# Patient Record
Sex: Female | Born: 1965 | Race: Black or African American | Hispanic: No | Marital: Single | Smoking: Never smoker
Health system: Southern US, Community
[De-identification: ages and names within clinical notes are randomized; demographics above are authoritative.]

## PROBLEM LIST (undated history)

## (undated) DIAGNOSIS — K589 Irritable bowel syndrome without diarrhea: Secondary | ICD-10-CM

## (undated) DIAGNOSIS — M1990 Unspecified osteoarthritis, unspecified site: Secondary | ICD-10-CM

## (undated) DIAGNOSIS — K219 Gastro-esophageal reflux disease without esophagitis: Secondary | ICD-10-CM

## (undated) DIAGNOSIS — R112 Nausea with vomiting, unspecified: Secondary | ICD-10-CM

## (undated) DIAGNOSIS — K5909 Other constipation: Secondary | ICD-10-CM

## (undated) HISTORY — DX: Unspecified osteoarthritis, unspecified site: M19.90

## (undated) HISTORY — PX: ABDOMINAL HYSTERECTOMY: SHX81

## (undated) HISTORY — PX: BREAST BIOPSY: SHX20

---

## 2003-08-12 ENCOUNTER — Emergency Department (HOSPITAL_COMMUNITY): Admission: EM | Admit: 2003-08-12 | Discharge: 2003-08-12 | Payer: Self-pay | Admitting: Emergency Medicine

## 2005-08-20 ENCOUNTER — Encounter (INDEPENDENT_AMBULATORY_CARE_PROVIDER_SITE_OTHER): Payer: Self-pay | Admitting: Specialist

## 2005-08-20 ENCOUNTER — Inpatient Hospital Stay (HOSPITAL_COMMUNITY): Admission: RE | Admit: 2005-08-20 | Discharge: 2005-08-22 | Payer: Self-pay | Admitting: Obstetrics & Gynecology

## 2009-02-28 ENCOUNTER — Encounter (INDEPENDENT_AMBULATORY_CARE_PROVIDER_SITE_OTHER): Payer: Self-pay | Admitting: *Deleted

## 2009-03-13 HISTORY — PX: COLONOSCOPY WITH ESOPHAGOGASTRODUODENOSCOPY (EGD): SHX5779

## 2009-04-04 ENCOUNTER — Ambulatory Visit: Payer: Self-pay | Admitting: Gastroenterology

## 2009-04-04 DIAGNOSIS — R635 Abnormal weight gain: Secondary | ICD-10-CM

## 2009-04-04 DIAGNOSIS — K5900 Constipation, unspecified: Secondary | ICD-10-CM | POA: Insufficient documentation

## 2009-04-04 DIAGNOSIS — K5909 Other constipation: Secondary | ICD-10-CM

## 2009-04-04 DIAGNOSIS — K219 Gastro-esophageal reflux disease without esophagitis: Secondary | ICD-10-CM | POA: Insufficient documentation

## 2009-04-09 ENCOUNTER — Encounter: Payer: Self-pay | Admitting: Internal Medicine

## 2009-04-09 ENCOUNTER — Ambulatory Visit: Payer: Self-pay | Admitting: Gastroenterology

## 2009-04-09 ENCOUNTER — Ambulatory Visit (HOSPITAL_COMMUNITY): Admission: RE | Admit: 2009-04-09 | Discharge: 2009-04-09 | Payer: Self-pay | Admitting: Gastroenterology

## 2009-04-11 ENCOUNTER — Encounter: Payer: Self-pay | Admitting: Gastroenterology

## 2009-04-16 ENCOUNTER — Encounter: Payer: Self-pay | Admitting: Gastroenterology

## 2010-05-13 NOTE — Letter (Signed)
Summary: Scheduled Appointment  [facility]  [address]   [place], [place] [address]   Phone: [phone]  Fax: [phone]    February 28, 2009   Dear: Tara Wolf            DOB: 04-20-1965    I have been instructed to schedule you an appointment in our office.  Your appointment is as follows:   Date:   April 04, 2009   Time:   130PM     Please be here 15 minutes early.   Provider:   Tana Coast, PA    Please contact the office if you need to reschedule this appointment for a more convenient time.   Thank you,    Diana Eves       [facility] Ph: [phone]   Fax: [phone]

## 2010-05-13 NOTE — Letter (Signed)
Summary: release of records  release of records   Imported By: Diana Eves 04/16/2009 16:01:38  _____________________________________________________________________  External Attachment:    Type:   Image     Comment:   External Document

## 2010-05-13 NOTE — Letter (Signed)
Summary: REFERRAL DR Kenney Houseman  REFERRAL DR Kenney Houseman   Imported By: Diana Eves 04/09/2009 10:02:38  _____________________________________________________________________  External Attachment:    Type:   Image     Comment:   External Document
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
## 2010-05-13 NOTE — Letter (Signed)
Summary: EGD/TCS ORDER  EGD/TCS ORDER   Imported By: Ave Filter 04/11/2009 15:32:58  _____________________________________________________________________  External Attachment:    Type:   Image     Comment:   External Document

## 2010-08-29 NOTE — Op Note (Signed)
Tara Wolf, Tara Wolf         ACCOUNT NO.:  [id number]   MEDICAL RECORD NO.:  [id number]          PATIENT TYPE:  INP   LOCATION:  A428                          FACILITY:  [facility]   PHYSICIAN:  Lazaro Arms, M.D.   DATE OF BIRTH:  07-Oct-1965   DATE OF PROCEDURE:  08/21/1998  DATE OF DISCHARGE:  08/22/2005                                 OPERATIVE REPORT   PREOPERATIVE DIAGNOSES:  1.  Enlarged fibroid uterus.  2.  Pelvic pain.  3.  Uterine descensus.   POSTOPERATIVE DIAGNOSES:  1.  Enlarged fibroid uterus.  2.  Pelvic pain.  3.  Uterine descensus.   PROCEDURE:  CHR.   SURGEON:  Lazaro Arms, M.D.   ANESTHESIA:  General endotracheal.   FINDINGS:  The patient had enlarged fibroids actually pushing in on her  perineum.  She was having some uterine descensus as a result of that.  However, it was 14 to 16 weeks' size and could not be removed vaginally.  As  a result, she is admitted for abdominal management.  At the time of surgery,  this was indeed confirmed.   DESCRIPTION OF OPERATION:  The patient was taken to the operating room and  placed in supine position, underwent general endotracheal anesthesia,  prepped and draped in the usual sterile fashion.  A Pfannenstiel skin  incision was made and carried down sharply to the rectus fascia, scored in  the midline and extended laterally.  The fascia was taken off the muscle  superiorly and inferiorly without difficulty.  The muscles were divided.  The peritoneal cavity was entered.  A self-retaining retractor was placed.  Uterine cornua were grasped and suture ligated bilaterally.  The right ovary  was very closely adherent to the fibroid on the right and could not really  be separated out.  The infundibulopelvic ligament on the right was clamped,  cut, and suture ligated.  The uterine vessels were skeletonized bilaterally  and clamped, cut, and suture ligated.  Serial fascial pedicles were taken  down to the cervix through  the cardinal ligament, each pedicle being  clamped, cut, and suture ligated.  The vagina was cross clamped.  Specimen  was removed.  Vaginal angle sutures were placed.  The vagina was closed with  interrupted figure-of-eight sutures.  Pelvis was irrigated vigorously.  All  pedicles were found to be hemostatic.  Intercede was placed over the vaginal  cuff.  The instruments were removed.  The muscles of peritoneum were  reapproximated loosely.  The fascia  was closed using 0 Vicryl running.  The subcutaneous tissue was made  hemostatic and irrigated.  The skin was closed using skin staples.  The  patient tolerated the procedure well.  She experienced 200 cc of blood loss.  Taken to the recovery room in good and stable condition.  All counts correct  x 3.      Lazaro Arms, M.D.  Electronically Signed     LHE/MEDQ  D:  10/12/2005  T:  10/12/2005  Job:  [id number]

## 2010-08-29 NOTE — Discharge Summary (Signed)
Tara Wolf, Tara Wolf         ACCOUNT NO.:  [id number]   MEDICAL RECORD NO.:  [id number]          PATIENT TYPE:  INP   LOCATION:  A428                          FACILITY:  [facility]   PHYSICIAN:  Tilda Burrow, M.D. DATE OF BIRTH:  1966-01-27   DATE OF ADMISSION:  08/20/2005  DATE OF DISCHARGE:  05/12/2007LH                                 DISCHARGE SUMMARY   ADMITTING DIAGNOSES:  1.  Uterine fibroids 14 weeks size.  2.  Pelvic pain.  3.  Uterine descensus grade III.   DISCHARGE DIAGNOSES:  1.  Uterine fibroids 14 weeks size.  2.  Pelvic pain.  3.  Uterine descensus grade III.   PROCEDURE:  Aug 20, 2005 - total abdominal hysterectomy, right salpingo-  oophorectomy, Duane Lope, M.D.   DISCHARGE MEDICATIONS:  1.  Tylox, #30 tablets, 1-2 q.6h. p.r.n. pain.  2.  Motrin 800 mg, 24 tablets, one q.8h. for mild main.   FOLLOW UP:  Aug 26, 2005 with Dr. Despina Hidden for staple removal.   HOSPITAL SUMMARY:  This is a 44 year old female with a 14 week size fibroid  uterus, causing a large amount of pelvic pain and uterine descensus, and  heavy menses that are still regular.  He is admitted for an abdominal  hysterectomy.  See HPI.   HOSPITAL COURSE:  The patient was admitted with hemoglobin of 12.7,  hematocrit of 38.2, BUN 12, creatinine 0.7, glucose 89, potassium 3.6.  Liver functions normal with AST of 20, ALT of 10, urinalysis negative, hCG  negative.  She underwent total abdominal hysterectomy as described in the  operative note.  On postoperative day #1, hemoglobin was 11.5, hematocrit  34.4.  She remained afebrile during the postpartum course.  Maximum  temperature was 99.0.  Blood pressures were normal.  Urine output was good.  She was stable for discharge on postoperative day #2, tolerating a regular  diet with good incision comfort.  She has slight sensitivity on the left  side, but no visible abnormalities noted.  This was felt within normal  limits.  The patient was discharged  home with routine post surgical  instructions given, including Tylox and Motrin prescription for follow up in  one week.   ADDENDUM:  Pathology report returned a normal uterus weighing 471 gm with  removal of the right adnexa performed, as well, showing a normal fallopian  tube and benign follicular cyst of the right ovary.      Tilda Burrow, M.D.  Electronically Signed     JVF/MEDQ  D:  08/22/2005  T:  08/24/2005  Job:  [id number]   cc:   [facility]

## 2010-10-26 ENCOUNTER — Emergency Department (HOSPITAL_COMMUNITY)
Admission: EM | Admit: 2010-10-26 | Discharge: 2010-10-26 | Disposition: A | Discharge status: Home / Self Care | Payer: Self-pay | Attending: Emergency Medicine | Admitting: Emergency Medicine

## 2010-10-26 DIAGNOSIS — T622X1A Toxic effect of other ingested (parts of) plant(s), accidental (unintentional), initial encounter: Secondary | ICD-10-CM | POA: Insufficient documentation

## 2010-10-26 DIAGNOSIS — L237 Allergic contact dermatitis due to plants, except food: Secondary | ICD-10-CM

## 2010-10-26 DIAGNOSIS — L255 Unspecified contact dermatitis due to plants, except food: Secondary | ICD-10-CM | POA: Insufficient documentation

## 2010-10-26 MED ORDER — DIPHENHYDRAMINE HCL 25 MG PO CAPS
50.0000 mg | ORAL_CAPSULE | Freq: Once | ORAL | Status: AC
  Administered 2010-10-26: 50 mg via ORAL
  Filled 2010-10-26: qty 2

## 2010-10-26 MED ORDER — DIPHENHYDRAMINE HCL 50 MG PO CAPS: 50.0000 mg | ORAL_CAPSULE | Freq: Once | ORAL | Status: AC

## 2010-10-26 MED ORDER — DEXAMETHASONE SODIUM PHOSPHATE 10 MG/ML IJ SOLN
10.0000 mg | Freq: Once | INTRAMUSCULAR | Status: DC
  Administered 2010-10-26: 10 mg via INTRAMUSCULAR

## 2010-10-26 MED ORDER — PREDNISONE 10 MG PO TABS: ORAL_TABLET | ORAL | Status: DC

## 2010-10-26 MED ORDER — DEXAMETHASONE SODIUM PHOSPHATE 10 MG/ML IJ SOLN
10.0000 mg | Freq: Once | INTRAMUSCULAR | Status: DC
  Filled 2010-10-26: qty 1

## 2010-10-26 MED ORDER — DEXAMETHASONE SODIUM PHOSPHATE 4 MG/ML IJ SOLN
INTRAMUSCULAR | Status: AC
  Administered 2010-10-26: 10 mg via INTRAMUSCULAR
  Filled 2010-10-26: qty 3

## 2010-10-26 MED ORDER — DEXAMETHASONE SODIUM PHOSPHATE 4 MG/ML IJ SOLN
10.0000 mg | Freq: Once | INTRAMUSCULAR | Status: AC
  Administered 2010-10-26: 10 mg via INTRAMUSCULAR

## 2010-10-26 NOTE — ED Provider Notes (Addendum)
History     Chief Complaint  Patient presents with  . Poison Ivy   Patient is a 45 y.o. female presenting with Poison Ivy. The history is provided by the patient.  Poison Lajoyce Corners This is a new problem. The current episode started in the past 7 days. The problem occurs constantly. The problem has been gradually worsening. Associated symptoms include a rash. Pertinent negatives include no arthralgias, chills, coughing, fever or numbness. Exacerbated by: itching. Treatments tried: calamine,  benadryl. The treatment provided mild relief.    History reviewed. No pertinent past medical history.  History reviewed. No pertinent past surgical history.  History reviewed. No pertinent family history.  History  Substance Use Topics  . Smoking status: Never Smoker   . Smokeless tobacco: Not on file  . Alcohol Use: No    OB History    Grav Para Term Preterm Abortions TAB SAB Ect Mult Living                  Review of Systems  Constitutional: Negative for fever and chills.  Respiratory: Negative for cough.   Musculoskeletal: Negative for arthralgias.  Skin: Positive for rash.  Neurological: Negative for numbness.  All other systems reviewed and are negative.    Physical Exam  BP 120/95  Pulse 80  Temp(Src) 98.3 F (36.8 C) (Oral)  Resp 20  Ht 5\' 9"  (1.753 m)  Wt 165 lb (74.844 kg)  BMI 24.37 kg/m2  SpO2 96%  Physical Exam  Vitals reviewed. Constitutional: She is oriented to person, place, and time. She appears well-developed and well-nourished.  HENT:  Head: Normocephalic and atraumatic.  Eyes: Conjunctivae are normal.  Neck: Normal range of motion.  Cardiovascular: Normal rate, regular rhythm, normal heart sounds and intact distal pulses.   Pulmonary/Chest: Effort normal and breath sounds normal. She has no wheezes.  Abdominal: Soft. Bowel sounds are normal. There is no tenderness.  Musculoskeletal: Normal range of motion.  Neurological: She is alert and oriented to  person, place, and time.  Skin: Skin is warm and dry. Rash noted.       Scattered vesicles,  Some draining,  Some scabbed over on bilateral feet,  Lower extremities, forearms,  upper arms and hands.  Psychiatric: She has a normal mood and affect.    ED Course  Procedures  MDM  Contact dermatitis by exam and history.       Candis Musa, PA 10/26/10 1542  Candis Musa, PA 11/28/10 [phone]

## 2010-10-26 NOTE — ED Notes (Signed)
Pt has multiple sites on bilateral arms,legs, and areas on torso that has cluster raised red blister like rash. Pt states itches terribly and has taken benadryl and placed calamine lotion on all sites without relief.

## 2010-10-26 NOTE — ED Notes (Signed)
Pt presents with generalized itching from what pt thinks was poison ivy. Pt has been using OTC cream for itching.

## 2010-11-24 NOTE — ED Provider Notes (Signed)
History     CSN: [id number] Arrival date & time: 10/26/2010  2:28 PM  Chief Complaint  Patient presents with  . Poison Ivy   HPI  History reviewed. No pertinent past medical history.  History reviewed. No pertinent past surgical history.  History reviewed. No pertinent family history.  History  Substance Use Topics  . Smoking status: Never Smoker   . Smokeless tobacco: Not on file  . Alcohol Use: No    OB History    Grav Para Term Preterm Abortions TAB SAB Ect Mult Living                  Review of Systems  Physical Exam  BP 120/95  Pulse 80  Temp(Src) 98.3 F (36.8 C) (Oral)  Resp 20  Ht 5\' 9"  (1.753 m)  Wt 165 lb (74.844 kg)  BMI 24.37 kg/m2  SpO2 96%  Physical Exam  ED Course  Procedures  MDM  Evaluation and management procedures were performed by the PA/NP under my supervision/collaboration.        Felisa Bonier, MD 11/24/10 [phone]

## 2010-12-29 NOTE — ED Provider Notes (Signed)
Evaluation and management procedures were performed by the PA/NP under my supervision/collaboration.    Nile Prisk D Casimira Sutphin, MD 12/29/10 1309 

## 2012-04-13 ENCOUNTER — Encounter (HOSPITAL_COMMUNITY): Payer: Self-pay | Admitting: *Deleted

## 2012-04-13 ENCOUNTER — Emergency Department (HOSPITAL_COMMUNITY)
Admission: EM | Admit: 2012-04-13 | Discharge: 2012-04-13 | Disposition: A | Discharge status: Home / Self Care | Payer: Self-pay | Attending: Emergency Medicine | Admitting: Emergency Medicine

## 2012-04-13 DIAGNOSIS — H6690 Otitis media, unspecified, unspecified ear: Secondary | ICD-10-CM | POA: Insufficient documentation

## 2012-04-13 DIAGNOSIS — H6692 Otitis media, unspecified, left ear: Secondary | ICD-10-CM

## 2012-04-13 DIAGNOSIS — Z79899 Other long term (current) drug therapy: Secondary | ICD-10-CM | POA: Insufficient documentation

## 2012-04-13 DIAGNOSIS — K219 Gastro-esophageal reflux disease without esophagitis: Secondary | ICD-10-CM | POA: Insufficient documentation

## 2012-04-13 HISTORY — DX: Gastro-esophageal reflux disease without esophagitis: K21.9

## 2012-04-13 MED ORDER — AMOXICILLIN 250 MG PO CAPS
500.0000 mg | ORAL_CAPSULE | Freq: Once | ORAL | Status: AC
  Administered 2012-04-13: 500 mg via ORAL
  Filled 2012-04-13: qty 2

## 2012-04-13 MED ORDER — ANTIPYRINE-BENZOCAINE 5.4-1.4 % OT SOLN
3.0000 [drp] | Freq: Once | OTIC | Status: AC
  Administered 2012-04-13: 3 [drp] via OTIC
  Filled 2012-04-13: qty 10

## 2012-04-13 MED ORDER — IBUPROFEN 800 MG PO TABS
800.0000 mg | ORAL_TABLET | Freq: Once | ORAL | Status: AC
  Administered 2012-04-13: 800 mg via ORAL
  Filled 2012-04-13: qty 1

## 2012-04-13 MED ORDER — AMOXICILLIN 500 MG PO CAPS: 500.0000 mg | ORAL_CAPSULE | Freq: Three times a day (TID) | ORAL | Status: DC

## 2012-04-13 NOTE — ED Notes (Signed)
Patient with no complaints at this time. Respirations even and unlabored. Skin warm/dry. Discharge instructions reviewed with patient at this time. Patient given opportunity to voice concerns/ask questions. Patient discharged at this time and left Emergency Department with steady gait.   

## 2012-04-13 NOTE — ED Provider Notes (Signed)
History     CSN: [id number]  Arrival date & time 04/13/12  4098   First MD Initiated Contact with Patient 04/13/12 1011      Chief Complaint  Patient presents with  . Otalgia    (Consider location/radiation/quality/duration/timing/severity/associated sxs/prior treatment) Patient is a 47 y.o. female presenting with ear pain. The history is provided by the patient. No language interpreter was used.  Otalgia This is a new problem. The current episode started more than 1 week ago. There is pain in the left ear. The problem occurs constantly. There has been no fever. Associated symptoms include hearing loss. Pertinent negatives include no ear discharge, no rhinorrhea, no sore throat, no abdominal pain, no diarrhea, no vomiting, no cough and no rash. Her past medical history does not include chronic ear infection.    Past Medical History  Diagnosis Date  . GERD (gastroesophageal reflux disease)     History reviewed. No pertinent past surgical history.  No family history on file.  History  Substance Use Topics  . Smoking status: Never Smoker   . Smokeless tobacco: Not on file  . Alcohol Use: No    OB History    Grav Para Term Preterm Abortions TAB SAB Ect Mult Living                  Review of Systems  HENT: Positive for hearing loss and ear pain. Negative for sore throat, rhinorrhea and ear discharge.   Respiratory: Negative for cough.   Gastrointestinal: Negative for vomiting, abdominal pain and diarrhea.  Skin: Negative for rash.    Allergies  Review of patient's allergies indicates no known allergies.  Home Medications   Current Outpatient Rx  Name  Route  Sig  Dispense  Refill  . OMEPRAZOLE 20 MG PO CPDR   Oral   Take 20 mg by mouth daily.         Marland Kitchen PRESCRIPTION MEDICATION   Oral   Take 1 capsule by mouth daily. Used for IBS. Round, pale yellow capsule.         Marland Kitchen AMOXICILLIN 500 MG PO CAPS   Oral   Take 1 capsule (500 mg total) by mouth 3 (three)  times daily.   30 capsule   0     BP 113/77  Pulse 68  Temp 98.2 F (36.8 C) (Oral)  Resp 16  SpO2 98%  Physical Exam  Nursing note and vitals reviewed. Constitutional: She is oriented to person, place, and time. She appears well-developed and well-nourished. No distress.  HENT:  Head: Normocephalic and atraumatic.  Right Ear: Hearing, tympanic membrane, external ear and ear canal normal.  Left Ear: External ear and ear canal normal. No drainage. Tympanic membrane is injected and bulging. Decreased hearing is noted.  Eyes: EOM are normal.  Neck: Normal range of motion.  Cardiovascular: Normal rate and regular rhythm.   Pulmonary/Chest: Effort normal.  Abdominal: Soft. She exhibits no distension. There is no tenderness.  Musculoskeletal: Normal range of motion.  Neurological: She is alert and oriented to person, place, and time.  Skin: Skin is warm and dry.  Psychiatric: She has a normal mood and affect. Judgment normal.    ED Course  Procedures (including critical care time)  Labs Reviewed - No data to display No results found.   1. Left otitis media       MDM  rx-amoxicillin 500 mg TID, 30 Ibuprofen auragan prn pain F/u [facility]        Chancelor Hardrick  [place], [place] 04/13/12 1103  Evalina Field, [place] 04/19/12 2235

## 2012-04-13 NOTE — ED Notes (Signed)
Left ear pain x 1 week

## 2012-04-20 NOTE — ED Provider Notes (Signed)
Medical screening examination/treatment/procedure(s) were performed by non-physician practitioner and as supervising physician I was immediately available for consultation/collaboration.   Shelda Jakes, MD 04/20/12 [phone]

## 2013-05-09 ENCOUNTER — Ambulatory Visit: Payer: Self-pay | Admitting: Internal Medicine

## 2015-09-26 ENCOUNTER — Encounter (HOSPITAL_COMMUNITY): Payer: Self-pay | Admitting: Emergency Medicine

## 2015-09-26 ENCOUNTER — Emergency Department (HOSPITAL_COMMUNITY): Payer: BLUE CROSS/BLUE SHIELD

## 2015-09-26 ENCOUNTER — Emergency Department (HOSPITAL_COMMUNITY)
Admission: EM | Admit: 2015-09-26 | Discharge: 2015-09-27 | Disposition: A | Discharge status: Home / Self Care | Payer: BLUE CROSS/BLUE SHIELD | Attending: Emergency Medicine | Admitting: Emergency Medicine

## 2015-09-26 DIAGNOSIS — R109 Unspecified abdominal pain: Secondary | ICD-10-CM

## 2015-09-26 DIAGNOSIS — Z791 Long term (current) use of non-steroidal anti-inflammatories (NSAID): Secondary | ICD-10-CM | POA: Diagnosis not present

## 2015-09-26 DIAGNOSIS — R1032 Left lower quadrant pain: Secondary | ICD-10-CM | POA: Diagnosis not present

## 2015-09-26 DIAGNOSIS — Z79899 Other long term (current) drug therapy: Secondary | ICD-10-CM | POA: Insufficient documentation

## 2015-09-26 HISTORY — DX: Nausea with vomiting, unspecified: R11.2

## 2015-09-26 HISTORY — DX: Irritable bowel syndrome, unspecified: K58.9

## 2015-09-26 HISTORY — DX: Other constipation: K59.09

## 2015-09-26 LAB — CBC
HCT: 36.7 % (ref 36.0–46.0)
Hemoglobin: 12.2 g/dL (ref 12.0–15.0)
MCH: 28.1 pg (ref 26.0–34.0)
MCHC: 33.2 g/dL (ref 30.0–36.0)
MCV: 84.6 fL (ref 78.0–100.0)
Platelets: 243 10*3/uL (ref 150–400)
RBC: 4.34 MIL/uL (ref 3.87–5.11)
RDW: 14 % (ref 11.5–15.5)
WBC: 7.5 10*3/uL (ref 4.0–10.5)

## 2015-09-26 LAB — LIPASE, BLOOD: Lipase: 24 U/L (ref 11–51)

## 2015-09-26 LAB — POC OCCULT BLOOD, ED: Fecal Occult Bld: NEGATIVE

## 2015-09-26 LAB — COMPREHENSIVE METABOLIC PANEL
ALT: 16 U/L (ref 14–54)
AST: 25 U/L (ref 15–41)
Albumin: 4.4 g/dL (ref 3.5–5.0)
Alkaline Phosphatase: 50 U/L (ref 38–126)
Anion gap: 9 (ref 5–15)
BUN: 14 mg/dL (ref 6–20)
CO2: 25 mmol/L (ref 22–32)
Calcium: 9.1 mg/dL (ref 8.9–10.3)
Chloride: 102 mmol/L (ref 101–111)
Creatinine, Ser: 0.79 mg/dL (ref 0.44–1.00)
GFR calc Af Amer: >60 mL/min (ref >60)
GFR calc non Af Amer: >60 mL/min (ref >60)
Glucose, Bld: 90 mg/dL (ref 65–99)
Potassium: 3.6 mmol/L (ref 3.5–5.1)
Sodium: 136 mmol/L (ref 135–145)
Total Bilirubin: 0.6 mg/dL (ref 0.3–1.2)
Total Protein: 7.8 g/dL (ref 6.5–8.1)

## 2015-09-26 LAB — URINE MICROSCOPIC-ADD ON

## 2015-09-26 LAB — URINALYSIS, ROUTINE W REFLEX MICROSCOPIC
Bilirubin Urine: NEGATIVE
Glucose, UA: NEGATIVE mg/dL
Ketones, ur: NEGATIVE mg/dL
Leukocytes, UA: NEGATIVE
Nitrite: NEGATIVE
Protein, ur: NEGATIVE mg/dL
Specific Gravity, Urine: 1.02 (ref 1.005–1.030)
pH: 6 (ref 5.0–8.0)

## 2015-09-26 MED ORDER — MORPHINE SULFATE (PF) 4 MG/ML IV SOLN
4.0000 mg | INTRAVENOUS | Status: DC | PRN
  Administered 2015-09-26: 4 mg via INTRAVENOUS
  Filled 2015-09-26: qty 1

## 2015-09-26 MED ORDER — FAMOTIDINE IN NACL 20-0.9 MG/50ML-% IV SOLN
20.0000 mg | Freq: Once | INTRAVENOUS | Status: AC
  Administered 2015-09-26: 20 mg via INTRAVENOUS
  Filled 2015-09-26: qty 50

## 2015-09-26 MED ORDER — IOPAMIDOL (ISOVUE-300) INJECTION 61%
100.0000 mL | Freq: Once | INTRAVENOUS | Status: AC | PRN
  Administered 2015-09-26: 100 mL via INTRAVENOUS

## 2015-09-26 MED ORDER — SODIUM CHLORIDE 0.9 % IV SOLN
INTRAVENOUS | Status: DC
  Administered 2015-09-26: 22:00:00 via INTRAVENOUS

## 2015-09-26 NOTE — ED Provider Notes (Signed)
CSN: [id number]     Arrival date & time 09/26/15  2006 History   First MD Initiated Contact with Patient 09/26/15 2114     Chief Complaint  Patient presents with  . Abdominal Pain      HPI Pt was seen at 2125.  Per pt, c/o gradual onset and persistence of constant LLQ abd "pain" for the past 1 month. States for the past few days she has had BM's with "what looks like a tomato peeling" in them.  States she has not been taking her GERD meds "for a while now." States she "just takes laxatives to have a BM." Denies any change in her chronic N/V, no diarrhea, no fevers, no back pain, no rash, no CP/SOB, no black stools or emesis, no dysuria/hematuria.       Past Medical History  Diagnosis Date  . GERD (gastroesophageal reflux disease)   . IBS (irritable bowel syndrome)   . Chronic constipation   . Nausea and vomiting     chronic   Past Surgical History  Procedure Laterality Date  . Abdominal hysterectomy      Social History  Substance Use Topics  . Smoking status: Never Smoker   . Smokeless tobacco: None  . Alcohol Use: Yes    Review of Systems ROS: Statement: All systems negative except as marked or noted in the HPI; Constitutional: Negative for fever and chills. ; ; Eyes: Negative for eye pain, redness and discharge. ; ; ENMT: Negative for ear pain, hoarseness, nasal congestion, sinus pressure and sore throat. ; ; Cardiovascular: Negative for chest pain, palpitations, diaphoresis, dyspnea and peripheral edema. ; ; Respiratory: Negative for cough, wheezing and stridor. ; ; Gastrointestinal: +abd pain, chronic N/V. Negative for diarrhea, blood in stool, hematemesis, jaundice and rectal bleeding. . ; ; Genitourinary: Negative for dysuria, flank pain and hematuria. ; ; Musculoskeletal: Negative for back pain and neck pain. Negative for swelling and trauma.; ; Skin: Negative for pruritus, rash, abrasions, blisters, bruising and skin lesion.; ; Neuro: Negative for headache, lightheadedness  and neck stiffness. Negative for weakness, altered level of consciousness, altered mental status, extremity weakness, paresthesias, involuntary movement, seizure and syncope.      Allergies  Review of patient's allergies indicates no known allergies.  Home Medications   Prior to Admission medications   Medication Sig Start Date End Date Taking? Authorizing Provider  ibuprofen (ADVIL,MOTRIN) 200 MG tablet Take 200 mg by mouth every 6 (six) hours as needed for mild pain or moderate pain.   Yes Historical Provider, MD  simethicone (GAS-X) 80 MG chewable tablet Chew 160 mg by mouth every 6 (six) hours as needed for flatulence.   Yes Historical Provider, MD   BP 151/88 mmHg  Pulse 71  Temp(Src) 98.5 F (36.9 C) (Oral)  Resp 20  Ht 5\' 9"  (1.753 m)  Wt 190 lb (86.183 kg)  BMI 28.05 kg/m2  SpO2 100% Physical Exam  2130: Physical examination:  Nursing notes reviewed; Vital signs and O2 SAT reviewed;  Constitutional: Well developed, Well nourished, Well hydrated, In no acute distress; Head:  Normocephalic, atraumatic; Eyes: EOMI, PERRL, No scleral icterus; ENMT: Mouth and pharynx normal, Mucous membranes moist; Neck: Supple, Full range of motion, No lymphadenopathy; Cardiovascular: Regular rate and rhythm, No gallop; Respiratory: Breath sounds clear & equal bilaterally, No wheezes.  Speaking full sentences with ease, Normal respiratory effort/excursion; Chest: Nontender, Movement normal; Abdomen: Soft, +LLQ tenderness to palp. No rebound or guarding. Nondistended, Normal bowel sounds. Rectal exam performed w/permission  of pt and ED RN chaperone present.  Anal tone normal.  Non-tender, soft brown stool in rectal vault, heme neg.  No fissures, no external hemorrhoids, no palp masses.; Genitourinary: No CVA tenderness; Extremities: Pulses normal, No tenderness, No edema, No calf edema or asymmetry.; Neuro: AA&Ox3, Major CN grossly intact.  Speech clear. No gross focal motor or sensory deficits in  extremities.; Skin: Color normal, Warm, Dry.   ED Course  Procedures (including critical care time) Labs Review  Imaging Review  I have personally reviewed and evaluated these images and lab results as part of my medical decision-making.   EKG Interpretation None      MDM  MDM Reviewed: previous chart, nursing note and vitals Reviewed previous: labs Interpretation: labs, x-ray and CT scan     Results for orders placed or performed during the hospital encounter of 09/26/15  Lipase, blood  Result Value Ref Range   Lipase 24 11 - 51 U/L  Comprehensive metabolic panel  Result Value Ref Range   Sodium 136 135 - 145 mmol/L   Potassium 3.6 3.5 - 5.1 mmol/L   Chloride 102 101 - 111 mmol/L   CO2 25 22 - 32 mmol/L   Glucose, Bld 90 65 - 99 mg/dL   BUN 14 6 - 20 mg/dL   Creatinine, Ser 0.79 0.44 - 1.00 mg/dL   Calcium 9.1 8.9 - 10.3 mg/dL   Total Protein 7.8 6.5 - 8.1 g/dL   Albumin 4.4 3.5 - 5.0 g/dL   AST 25 15 - 41 U/L   ALT 16 14 - 54 U/L   Alkaline Phosphatase 50 38 - 126 U/L   Total Bilirubin 0.6 0.3 - 1.2 mg/dL   GFR calc non Af Amer >60 >60 mL/min   GFR calc Af Amer >60 >60 mL/min   Anion gap 9 5 - 15  CBC  Result Value Ref Range   WBC 7.5 4.0 - 10.5 K/uL   RBC 4.34 3.87 - 5.11 MIL/uL   Hemoglobin 12.2 12.0 - 15.0 g/dL   HCT 36.7 36.0 - 46.0 %   MCV 84.6 78.0 - 100.0 fL   MCH 28.1 26.0 - 34.0 pg   MCHC 33.2 30.0 - 36.0 g/dL   RDW 14.0 11.5 - 15.5 %   Platelets 243 150 - 400 K/uL  Urinalysis, Routine w reflex microscopic  Result Value Ref Range   Color, Urine YELLOW YELLOW   APPearance CLEAR CLEAR   Specific Gravity, Urine 1.020 1.005 - 1.030   pH 6.0 5.0 - 8.0   Glucose, UA NEGATIVE NEGATIVE mg/dL   Hgb urine dipstick SMALL (A) NEGATIVE   Bilirubin Urine NEGATIVE NEGATIVE   Ketones, ur NEGATIVE NEGATIVE mg/dL   Protein, ur NEGATIVE NEGATIVE mg/dL   Nitrite NEGATIVE NEGATIVE   Leukocytes, UA NEGATIVE NEGATIVE  Urine microscopic-add on  Result Value  Ref Range   Squamous Epithelial / LPF 6-30 (A) NONE SEEN   WBC, UA 0-5 0 - 5 WBC/hpf   RBC / HPF 0-5 0 - 5 RBC/hpf   Bacteria, UA MANY (A) NONE SEEN   Urine-Other AMORPHOUS URATES/PHOSPHATES   POC occult blood, ED  Result Value Ref Range   Fecal Occult Bld NEGATIVE NEGATIVE   Dg Chest 2 View 09/26/2015  CLINICAL DATA:  Acute onset of central chest pain and epigastric pain. Initial encounter. EXAM: CHEST  2 VIEW COMPARISON:  None. FINDINGS: The lungs are well-aerated and clear. There is no evidence of focal opacification, pleural effusion or pneumothorax. The heart  is normal in size; the mediastinal contour is within normal limits. No acute osseous abnormalities are seen. IMPRESSION: No acute cardiopulmonary process seen. Electronically Signed   By: Garald Balding M.D.   On: 09/26/2015 23:22   Ct Abdomen Pelvis W Contrast 09/26/2015  CLINICAL DATA:  Acute onset of left-sided abdominal pain. Initial encounter. EXAM: CT ABDOMEN AND PELVIS WITH CONTRAST TECHNIQUE: Multidetector CT imaging of the abdomen and pelvis was performed using the standard protocol following bolus administration of intravenous contrast. CONTRAST:  175mL ISOVUE-300 IOPAMIDOL (ISOVUE-300) INJECTION 61% COMPARISON:  None. FINDINGS: Minimal bibasilar atelectasis is noted. There is a hypoattenuating lesion with peripheral enhancement at the right hepatic lobe, measuring 3.9 cm. This may reflect an hemangioma, given the patient's normal LFTs. Would consider correlation with ultrasound, on a nonemergent basis. The gallbladder is within normal limits. The pancreas and adrenal glands are unremarkable. The kidneys are unremarkable in appearance. There is no evidence of hydronephrosis. No renal or ureteral stones are seen. No perinephric stranding is appreciated. No free fluid is identified. The small bowel is unremarkable in appearance. The stomach is within normal limits. No acute vascular abnormalities are seen. The appendix is normal in  caliber, without evidence of appendicitis. The bladder is mildly distended and grossly unremarkable in appearance. The patient is status post hysterectomy. The left ovaries unremarkable in appearance. No suspicious adnexal masses are seen. No inguinal lymphadenopathy is seen. No acute osseous abnormalities are identified. IMPRESSION: 1. No acute abnormality seen to explain the patient's symptoms. 2. 3.9 cm hypoattenuating lesion with peripheral enhancement at the right hepatic lobe. This may reflect an hemangioma, given the patient's normal LFTs. Would consider correlation with ultrasound, on an nonemergent basis. Electronically Signed   By: Garald Balding M.D.   On: 09/26/2015 23:46    2355:  Udip contaminated. Workup reassuring. Has tol PO well while in the ED without N/V.  Tx symptomatically, f/u GI MD. Dx and testing d/w pt and family.  Questions answered.  Verb understanding, agreeable to d/c home with outpt f/u.   Francine Graven, DO 09/29/15 0020

## 2015-09-26 NOTE — ED Notes (Signed)
Pt c/o left sided abd pain and states when she had a bowel movement it looked like red tomato peeling.

## 2015-09-27 MED ORDER — DICYCLOMINE HCL 20 MG PO TABS: 20.0000 mg | ORAL_TABLET | Freq: Four times a day (QID) | ORAL | Status: DC | PRN

## 2015-09-27 MED ORDER — FAMOTIDINE 20 MG PO TABS: 20.0000 mg | ORAL_TABLET | Freq: Two times a day (BID) | ORAL | Status: DC

## 2015-09-27 MED ORDER — ONDANSETRON HCL 4 MG PO TABS: 4.0000 mg | ORAL_TABLET | Freq: Three times a day (TID) | ORAL | Status: DC | PRN

## 2015-09-27 NOTE — Discharge Instructions (Signed)
Eat a bland diet, avoiding greasy, fatty, fried foods, as well as spicy and acidic foods or beverages.  Avoid eating within the hour or 2 before going to bed or laying down.  Also avoid teas, colas, coffee, chocolate, pepermint and spearment. Take the prescriptions as directed.  Call your regular medical doctor tomorrow to schedule a follow up appointment in the next 2 to 3 days. Call the GI doctor tomorrow to schedule a follow up appointment within the next week.  Return to the Emergency Department immediately if worsening.

## 2016-04-22 ENCOUNTER — Other Ambulatory Visit: Payer: Self-pay | Admitting: Family

## 2016-04-22 DIAGNOSIS — Z1239 Encounter for other screening for malignant neoplasm of breast: Secondary | ICD-10-CM

## 2016-06-03 ENCOUNTER — Ambulatory Visit: Payer: BLUE CROSS/BLUE SHIELD

## 2016-06-25 ENCOUNTER — Ambulatory Visit
Admission: RE | Admit: 2016-06-25 | Discharge: 2016-06-25 | Disposition: A | Discharge status: Home / Self Care | Payer: BLUE CROSS/BLUE SHIELD | Source: Ambulatory Visit | Attending: Family | Admitting: Family

## 2016-06-25 DIAGNOSIS — Z1239 Encounter for other screening for malignant neoplasm of breast: Secondary | ICD-10-CM

## 2016-06-25 DIAGNOSIS — Z1231 Encounter for screening mammogram for malignant neoplasm of breast: Secondary | ICD-10-CM | POA: Diagnosis not present

## 2016-11-11 ENCOUNTER — Telehealth: Payer: Self-pay | Admitting: Orthopedic Surgery

## 2016-11-11 NOTE — Telephone Encounter (Signed)
Patient called, per voice message 11/11/16, requesting a call back for appointment.  States has been trying to reach, however, no record appears of previous messages.  I reached voice mail, left message offering assistance, and requested to please return call at patient's cell# [phone]

## 2016-11-19 ENCOUNTER — Ambulatory Visit: Payer: BLUE CROSS/BLUE SHIELD | Admitting: Orthopaedic Surgery

## 2016-11-25 ENCOUNTER — Encounter: Payer: Self-pay | Admitting: Orthopaedic Surgery

## 2016-11-25 ENCOUNTER — Ambulatory Visit (INDEPENDENT_AMBULATORY_CARE_PROVIDER_SITE_OTHER): Payer: BLUE CROSS/BLUE SHIELD | Admitting: Orthopaedic Surgery

## 2016-11-25 VITALS — BP 114/73 | HR 64 | Temp 97.7°F | Ht 67.0 in | Wt 185.0 lb

## 2016-11-25 DIAGNOSIS — M7712 Lateral epicondylitis, left elbow: Secondary | ICD-10-CM

## 2016-11-25 MED ORDER — NAPROXEN 500 MG PO TABS: 500.0000 mg | ORAL_TABLET | Freq: Two times a day (BID) | ORAL | 5 refills | Status: DC

## 2016-11-25 NOTE — Progress Notes (Signed)
Subjective:    Patient ID: Tara Wolf, female    DOB: [birth year], 51 y.o.   MRN: [id number]  HPI She fell in June of this year and has had elbow pain on the left since then.  She has been seen at [facility].  She cannot fully extend the elbow. X-rays were negative there.  She has not been on any NSAIDs.  She has used ice, heat and has a elbow brace.  She says she has had some slight numbness at times.  She has no swelling, no redness.   Review of Systems  HENT: Negative for congestion.   Respiratory: Negative for cough and shortness of breath.   Cardiovascular: Negative for chest pain and leg swelling.  Endocrine: Positive for cold intolerance.  Musculoskeletal: Positive for arthralgias.  Allergic/Immunologic: Positive for environmental allergies.   Past Medical History:  Diagnosis Date  . Arthritis   . Chronic constipation   . GERD (gastroesophageal reflux disease)   . IBS (irritable bowel syndrome)   . Nausea and vomiting    chronic    Past Surgical History:  Procedure Laterality Date  . ABDOMINAL HYSTERECTOMY    . BREAST BIOPSY Left    neg    Current Outpatient Prescriptions on File Prior to Visit  Medication Sig Dispense Refill  . dicyclomine (BENTYL) 20 MG tablet Take 1 tablet (20 mg total) by mouth every 6 (six) hours as needed for spasms (abdominal cramping). (Patient not taking: Reported on 11/25/2016) 15 tablet 0  . famotidine (PEPCID) 20 MG tablet Take 1 tablet (20 mg total) by mouth 2 (two) times daily. 30 tablet 0  . ibuprofen (ADVIL,MOTRIN) 200 MG tablet Take 200 mg by mouth every 6 (six) hours as needed for mild pain or moderate pain.    Marland Kitchen ondansetron (ZOFRAN) 4 MG tablet Take 1 tablet (4 mg total) by mouth every 8 (eight) hours as needed for nausea or vomiting. (Patient not taking: Reported on 11/25/2016) 6 tablet 0  . simethicone (GAS-X) 80 MG chewable tablet Chew 160 mg by mouth every 6 (six) hours as needed for flatulence.     No  current facility-administered medications on file prior to visit.     Social History   Social History  . Marital status: Single    Spouse name: N/A  . Number of children: N/A  . Years of education: N/A   Occupational History  . Not on file.   Social History Main Topics  . Smoking status: Never Smoker  . Smokeless tobacco: Never Used  . Alcohol use Yes  . Drug use: No  . Sexual activity: Not on file   Other Topics Concern  . Not on file   Social History Narrative  . No narrative on file    Family History  Problem Relation Age of Onset  . Breast cancer Cousin   . Hypertension Mother   . Cancer Father     BP 114/73   Pulse 64   Temp 97.7 F (36.5 C)   Ht 5\' 7"  (1.702 m)   Wt 185 lb (83.9 kg)   BMI 28.98 kg/m      Objective:   Physical Exam  Constitutional: She is oriented to person, place, and time. She appears well-developed and well-nourished.  HENT:  Head: Normocephalic and atraumatic.  Eyes: Pupils are equal, round, and reactive to light. Conjunctivae and EOM are normal.  Neck: Normal range of motion. Neck supple.  Cardiovascular: Normal rate, regular rhythm and intact  distal pulses.   Pulmonary/Chest: Effort normal.  Abdominal: Soft.  Musculoskeletal: She exhibits tenderness (left elbow is tender, ROM is 10 to 90 but with persuassion she can have full extension, full pronation and supination.  She is very tender over the lateral epicondyle with pain to resisted extension.  NV intact.  Right side negative.  Left shoulder  OK.).  Neurological: She is alert and oriented to person, place, and time. She displays normal reflexes. No cranial nerve deficit. She exhibits normal muscle tone. Coordination normal.  Skin: Skin is warm and dry.  Psychiatric: She has a normal mood and affect. Her behavior is normal. Judgment and thought content normal.     X-rays from [facility] reviewed.     Assessment & Plan:   Encounter Diagnosis  Name Primary?  . Lateral  epicondylitis of left elbow Yes   I will begin Naprosyn for her one bid pc.  I have ordered PT for her. I have told her how to do ice massage.  Return in two weeks.  Call if any problem.  Precautions discussed.      Electronically Signed Sanjuana Kava, MD 8/15/20181:52 PM

## 2017-05-07 ENCOUNTER — Other Ambulatory Visit: Payer: Self-pay

## 2017-05-07 ENCOUNTER — Emergency Department (HOSPITAL_COMMUNITY)
Admission: EM | Admit: 2017-05-07 | Discharge: 2017-05-07 | Disposition: A | Discharge status: Home / Self Care | Payer: BLUE CROSS/BLUE SHIELD | Attending: Emergency Medicine | Admitting: Emergency Medicine

## 2017-05-07 ENCOUNTER — Encounter (HOSPITAL_COMMUNITY): Payer: Self-pay | Admitting: Emergency Medicine

## 2017-05-07 ENCOUNTER — Emergency Department (HOSPITAL_COMMUNITY): Payer: BLUE CROSS/BLUE SHIELD

## 2017-05-07 DIAGNOSIS — R112 Nausea with vomiting, unspecified: Secondary | ICD-10-CM | POA: Insufficient documentation

## 2017-05-07 DIAGNOSIS — N390 Urinary tract infection, site not specified: Secondary | ICD-10-CM | POA: Diagnosis not present

## 2017-05-07 DIAGNOSIS — R55 Syncope and collapse: Secondary | ICD-10-CM | POA: Insufficient documentation

## 2017-05-07 DIAGNOSIS — Z79899 Other long term (current) drug therapy: Secondary | ICD-10-CM | POA: Insufficient documentation

## 2017-05-07 LAB — BASIC METABOLIC PANEL
Anion gap: 11 (ref 5–15)
BUN: 12 mg/dL (ref 6–20)
CO2: 25 mmol/L (ref 22–32)
Calcium: 9.5 mg/dL (ref 8.9–10.3)
Chloride: 103 mmol/L (ref 101–111)
Creatinine, Ser: 0.75 mg/dL (ref 0.44–1.00)
GFR calc Af Amer: >60 mL/min (ref >60)
GFR calc non Af Amer: >60 mL/min (ref >60)
Glucose, Bld: 89 mg/dL (ref 65–99)
Potassium: 3.6 mmol/L (ref 3.5–5.1)
Sodium: 139 mmol/L (ref 135–145)

## 2017-05-07 LAB — URINALYSIS, ROUTINE W REFLEX MICROSCOPIC
Bilirubin Urine: NEGATIVE
Glucose, UA: NEGATIVE mg/dL
Ketones, ur: NEGATIVE mg/dL
Nitrite: POSITIVE — AB
Protein, ur: NEGATIVE mg/dL
Specific Gravity, Urine: 1.015 (ref 1.005–1.030)
pH: 7 (ref 5.0–8.0)

## 2017-05-07 LAB — CBC
HCT: 40.4 % (ref 36.0–46.0)
Hemoglobin: 13 g/dL (ref 12.0–15.0)
MCH: 27.7 pg (ref 26.0–34.0)
MCHC: 32.2 g/dL (ref 30.0–36.0)
MCV: 86 fL (ref 78.0–100.0)
Platelets: 266 10*3/uL (ref 150–400)
RBC: 4.7 MIL/uL (ref 3.87–5.11)
RDW: 14 % (ref 11.5–15.5)
WBC: 6.1 10*3/uL (ref 4.0–10.5)

## 2017-05-07 LAB — CBG MONITORING, ED: Glucose-Capillary: 72 mg/dL (ref 65–99)

## 2017-05-07 MED ORDER — CEPHALEXIN 500 MG PO CAPS: 500.0000 mg | ORAL_CAPSULE | Freq: Two times a day (BID) | ORAL | 0 refills | Status: AC

## 2017-05-07 NOTE — ED Triage Notes (Addendum)
Patient reports becoming dizzy while walking to crack van this morning. Patient states "I was standing one minute and on the ground the next." Patient unsure of how long syncopal episode lasted. Patient c/o left side head/face pain. Patient also reports near syncopal episode 2 days ago after becoming dizzy. Patient seen at PCP and had EKG. Patient sent to ED for further evaluation. Denies taking any type of blood thinners. Per patient has had some right lower abd pain with vomiting that "has always been going on." Abrasion to right knee and palms bilaterally noted.

## 2017-05-07 NOTE — Discharge Instructions (Addendum)
You were seen in the emergency department after you lost consciousness. The CT scan of your head was normal.   Please call to schedule an appointment with the cardiologist listed as soon as possible.   Reasons to return to care would be if you have shortness of breath that is not resolving, or if you are unable to stay hydrated by mouth, or if you have further episodes of loss of consciousness.

## 2017-05-07 NOTE — ED Provider Notes (Signed)
[facility] EMERGENCY DEPARTMENT Provider Note   CSN: [id number] Arrival date & time: 05/07/17  1110   History   Chief Complaint Chief Complaint  Patient presents with  . Loss of Consciousness    HPI Tara Wolf is a 52 y.o. female with PMH IBS and chronic nausea and vomiting presenting after a syncopal episode this AM. Patient was in her usual state of health when she suddenly lost consciousness while walking out to the car. She had no prodromal symptoms: no palpitations, dyspnea, or light-headedness. She suddenly found herself on the ground and she had abrasions on her bilateral palms, her right knee and her left face. The fall was unwitnessed. She stayed on the ground for a minute, feeling "out of it" with some dizziness but no palpitations, dyspnea. She was able to get up, go to the bathroom and wash off with some shortness of breath after the episode which has now resolved. Patient reports she had one similar episode 2 weeks ago when she fell to the side with near syncope.   She endorses chronic vomiting over the past several months, with 4-5 episodes yesterday. She indicates that this happens every other day. She sometimes has diarrhea with IBS symptoms but no episodes of diarrhea over the past several days. She has been staying hydrated and taking fluids by mouth. She has not had fevers, rhinorrhea, or congestion.  Past Medical History:  Diagnosis Date  . Arthritis   . Chronic constipation   . GERD (gastroesophageal reflux disease)   . IBS (irritable bowel syndrome)   . Nausea and vomiting    chronic    Patient Active Problem List   Diagnosis Date Noted  . GERD 04/04/2009  . CONSTIPATION, CHRONIC 04/04/2009  . WEIGHT GAIN 04/04/2009    Past Surgical History:  Procedure Laterality Date  . ABDOMINAL HYSTERECTOMY    . BREAST BIOPSY Left    neg    OB History    No data available      Home Medications    Prior to Admission medications   Medication Sig  Start Date End Date Taking? Authorizing Provider  cephALEXin (KEFLEX) 500 MG capsule Take 1 capsule (500 mg total) by mouth 2 (two) times daily for 7 days. 05/07/17 05/14/17  Everrett Coombe, MD  dicyclomine (BENTYL) 20 MG tablet Take 1 tablet (20 mg total) by mouth every 6 (six) hours as needed for spasms (abdominal cramping). Patient not taking: Reported on 11/25/2016 09/27/15   Francine Graven, DO  famotidine (PEPCID) 20 MG tablet Take 1 tablet (20 mg total) by mouth 2 (two) times daily. 09/27/15   Francine Graven, DO  naproxen (NAPROSYN) 500 MG tablet Take 1 tablet (500 mg total) by mouth 2 (two) times daily with a meal. 11/25/16   Sanjuana Kava, MD  ondansetron (ZOFRAN) 4 MG tablet Take 1 tablet (4 mg total) by mouth every 8 (eight) hours as needed for nausea or vomiting. Patient not taking: Reported on 11/25/2016 09/27/15   Francine Graven, DO  simethicone (GAS-X) 80 MG chewable tablet Chew 160 mg by mouth every 6 (six) hours as needed for flatulence.    [provider]    Family History Family History  Problem Relation Age of Onset  . Breast cancer Cousin   . Hypertension Mother   . Cancer Father     Social History Social History   Tobacco Use  . Smoking status: Never Smoker  . Smokeless tobacco: Never Used  Substance Use Topics  . Alcohol  use: Yes    Comment: ocassional  . Drug use: No     Allergies   Patient has no known allergies.   Review of Systems Review of Systems   Physical Exam Updated Vital Signs BP 135/76 (BP Location: Left Arm)   Pulse 67   Temp 98.2 F (36.8 C) (Oral)   Resp 18   Ht 5\' 9"  (1.753 m)   Wt 84.8 kg (187 lb)   SpO2 100%   BMI 27.62 kg/m   Physical Exam  Constitutional: She is oriented to person, place, and time. She appears well-developed and well-nourished.  HENT:  Head: Normocephalic and atraumatic.  Eyes: EOM are normal. Pupils are equal, round, and reactive to light.  Neck: Neck supple.  Cardiovascular: Regular rhythm.  Exam reveals no gallop and no friction rub.  No murmur heard. +mildly bradycardic  Pulmonary/Chest: Effort normal and breath sounds normal. She has no wheezes. She has no rales. She exhibits no tenderness.  Abdominal: Soft. Bowel sounds are normal.  Musculoskeletal: She exhibits no edema or deformity.  Neurological: She is alert and oriented to person, place, and time. No cranial nerve deficit or sensory deficit. She exhibits normal muscle tone. Coordination normal.  Skin: Skin is warm and dry. Capillary refill takes less than 2 seconds.  Psychiatric: She has a normal mood and affect. Thought content normal.     ED Treatments / Results  Labs (all labs ordered are listed, but only abnormal results are displayed) Labs Reviewed  URINALYSIS, ROUTINE W REFLEX MICROSCOPIC - Abnormal; Notable for the following components:      Result Value   APPearance HAZY (*)    Hgb urine dipstick SMALL (*)    Nitrite POSITIVE (*)    Leukocytes, UA SMALL (*)    Bacteria, UA RARE (*)    Squamous Epithelial / LPF 0-5 (*)    All other components within normal limits  BASIC METABOLIC PANEL  CBC  CBG MONITORING, ED    EKG  EKG Interpretation None       Radiology Ct Head Wo Contrast  Result Date: 05/07/2017 CLINICAL DATA:  Recent syncopal episode EXAM: CT HEAD WITHOUT CONTRAST TECHNIQUE: Contiguous axial images were obtained from the base of the skull through the vertex without intravenous contrast. COMPARISON:  None. FINDINGS: Brain: No evidence of acute infarction, hemorrhage, hydrocephalus, extra-axial collection or mass lesion/mass effect. Vascular: No hyperdense vessel or unexpected calcification. Skull: Normal. Negative for fracture or focal lesion. Sinuses/Orbits: No acute finding. Other: None. IMPRESSION: Normal CT of the head Electronically Signed   By: Inez Catalina M.D.   On: 05/07/2017 14:57    Procedures Procedures (including critical care time)  Medications Ordered in ED Medications -  No data to display   Initial Impression / Assessment and Plan / ED Course  I have reviewed the triage vital signs and the nursing notes.  Pertinent labs & imaging results that were available during my care of the patient were reviewed by me and considered in my medical decision making (see chart for details).    52 yo w history of IBS, chronic nausea and vomiting presenting after a syncopal episode. Differential for syncope includes cardiac etiology. She was mildly bradycardic to 57 which cannot be explained by her medications, and she lost consciousness with no prodromal symptoms. EKG shows NSR. Her electrolytes are WNL. Also considered intracranial pathology considering her history of vomiting without diarrhea, and CT head was negative for acute process. Infectious etiology seems less likely without infectious  symptoms or fever, no white count. Glucose is WNL at 89 on BMP, so this is not likely a hypoglycemic episode.   Patient was noted to have nitrites and leukocytes in her urine and she was treated for a UTI with keflex 500 mg BID x7d.  Patient continued to be well-appearing and stable throughout her time in the ED. Her workup was negative. She was considered stable for discharge. Follow up information for cardiology was provided. Return precautions were discussed.  Final Clinical Impressions(s) / ED Diagnoses   Final diagnoses:  Syncope, unspecified syncope type  Urinary tract infection without hematuria, site unspecified    ED Discharge Orders        Ordered    cephALEXin (KEFLEX) 500 MG capsule  2 times daily     05/07/17 1538       Everrett Coombe, MD 05/07/17 1514    Everrett Coombe, MD 05/07/17 1540    Elnora Morrison, MD 05/07/17 [phone]

## 2017-05-19 DIAGNOSIS — R55 Syncope and collapse: Secondary | ICD-10-CM

## 2017-05-19 DIAGNOSIS — G43909 Migraine, unspecified, not intractable, without status migrainosus: Secondary | ICD-10-CM | POA: Insufficient documentation

## 2017-05-19 DIAGNOSIS — K589 Irritable bowel syndrome without diarrhea: Secondary | ICD-10-CM | POA: Insufficient documentation

## 2017-05-19 HISTORY — DX: Syncope and collapse: R55

## 2017-08-14 ENCOUNTER — Encounter (HOSPITAL_COMMUNITY): Payer: Self-pay

## 2017-08-14 ENCOUNTER — Emergency Department (HOSPITAL_COMMUNITY)
Admission: EM | Admit: 2017-08-14 | Discharge: 2017-08-14 | Disposition: A | Discharge status: Home / Self Care | Payer: BLUE CROSS/BLUE SHIELD | Attending: Emergency Medicine | Admitting: Emergency Medicine

## 2017-08-14 ENCOUNTER — Other Ambulatory Visit: Payer: Self-pay

## 2017-08-14 DIAGNOSIS — J029 Acute pharyngitis, unspecified: Secondary | ICD-10-CM | POA: Diagnosis present

## 2017-08-14 DIAGNOSIS — J020 Streptococcal pharyngitis: Secondary | ICD-10-CM | POA: Diagnosis not present

## 2017-08-14 DIAGNOSIS — Z79899 Other long term (current) drug therapy: Secondary | ICD-10-CM | POA: Insufficient documentation

## 2017-08-14 LAB — GROUP A STREP BY PCR: Group A Strep by PCR: DETECTED — AB

## 2017-08-14 MED ORDER — DEXAMETHASONE SODIUM PHOSPHATE 4 MG/ML IJ SOLN
10.0000 mg | Freq: Once | INTRAMUSCULAR | Status: AC
  Administered 2017-08-14: 10 mg via INTRAMUSCULAR
  Filled 2017-08-14: qty 3

## 2017-08-14 MED ORDER — LIDOCAINE VISCOUS 2 % MT SOLN
15.0000 mL | Freq: Once | OROMUCOSAL | Status: AC
  Administered 2017-08-14: 15 mL via OROMUCOSAL
  Filled 2017-08-14: qty 15

## 2017-08-14 MED ORDER — PENICILLIN G BENZATHINE 1200000 UNIT/2ML IM SUSP
1.2000 10*6.[IU] | Freq: Once | INTRAMUSCULAR | Status: AC
  Administered 2017-08-14: 1.2 10*6.[IU] via INTRAMUSCULAR
  Filled 2017-08-14: qty 2

## 2017-08-14 NOTE — Discharge Instructions (Addendum)
Use Tylenol or ibuprofen as needed for pain.  Follow-up with your doctor.  Return to the ED if you develop difficulty breathing, difficulty swallowing or other concerns.

## 2017-08-14 NOTE — ED Provider Notes (Signed)
[facility] EMERGENCY DEPARTMENT Provider Note   CSN: [id number] Arrival date & time: 08/14/17  0132     History   Chief Complaint Chief Complaint  Patient presents with  . Sore Throat    HPI Tara Wolf is a 52 y.o. female.  Patient with 1 week of sore throat that is worse with swallowing.  Reports pain is constant and became worse today.  Denies fevers, chills, nausea or vomiting.  She is taken over-the-counter throat sprays and Sucrets without relief.  believes that her voice is muffled.  She works around Network engineer.  She denies any cough, chest pain or shortness of breath.  No headache.  No difficulty swallowing secretions.  The history is provided by the patient.  Sore Throat  Pertinent negatives include no chest pain, no abdominal pain and no headaches.    Past Medical History:  Diagnosis Date  . Arthritis   . Chronic constipation   . GERD (gastroesophageal reflux disease)   . IBS (irritable bowel syndrome)   . Nausea and vomiting    chronic    Patient Active Problem List   Diagnosis Date Noted  . GERD 04/04/2009  . CONSTIPATION, CHRONIC 04/04/2009  . WEIGHT GAIN 04/04/2009    Past Surgical History:  Procedure Laterality Date  . ABDOMINAL HYSTERECTOMY    . BREAST BIOPSY Left    neg     OB History   None      Home Medications    Prior to Admission medications   Medication Sig Start Date End Date Taking? Authorizing Provider  pantoprazole (PROTONIX) 40 MG tablet Take by mouth.   Yes [provider]  simethicone (GAS-X) 80 MG chewable tablet Chew 160 mg by mouth every 6 (six) hours as needed for flatulence.   Yes [provider]  dicyclomine (BENTYL) 20 MG tablet Take 1 tablet (20 mg total) by mouth every 6 (six) hours as needed for spasms (abdominal cramping). Patient not taking: Reported on 11/25/2016 09/27/15   Francine Graven, DO  famotidine (PEPCID) 20 MG tablet Take 1 tablet (20 mg total) by mouth 2 (two) times  daily. 09/27/15   Francine Graven, DO  naproxen (NAPROSYN) 500 MG tablet Take 1 tablet (500 mg total) by mouth 2 (two) times daily with a meal. 11/25/16   Sanjuana Kava, MD  ondansetron (ZOFRAN) 4 MG tablet Take 1 tablet (4 mg total) by mouth every 8 (eight) hours as needed for nausea or vomiting. Patient not taking: Reported on 11/25/2016 09/27/15   Francine Graven, DO    Family History Family History  Problem Relation Age of Onset  . Breast cancer Cousin   . Hypertension Mother   . Cancer Father     Social History Social History   Tobacco Use  . Smoking status: Never Smoker  . Smokeless tobacco: Never Used  Substance Use Topics  . Alcohol use: Yes    Comment: ocassional  . Drug use: No     Allergies   Patient has no known allergies.   Review of Systems Review of Systems  Constitutional: Negative for activity change and appetite change.  HENT: Positive for sore throat, trouble swallowing and voice change. Negative for congestion and rhinorrhea.   Respiratory: Negative for cough and choking.   Cardiovascular: Negative for chest pain.  Gastrointestinal: Negative for abdominal pain and vomiting.  Genitourinary: Negative for dysuria, hematuria, vaginal bleeding and vaginal discharge.  Musculoskeletal: Negative for arthralgias.  Skin: Negative for rash.  Neurological: Negative for dizziness,  weakness and headaches.   all other systems are negative except as noted in the HPI and PMH.     Physical Exam Updated Vital Signs BP (!) 123/98 (BP Location: Right Arm)   Pulse 91   Temp 99.4 F (37.4 C) (Oral)   Resp 20   Ht 5\' 9"  (1.753 m)   Wt 86.2 kg (190 lb)   SpO2 100%   BMI 28.06 kg/m   Physical Exam  Constitutional: She is oriented to person, place, and time. She appears well-developed and well-nourished. No distress.  HENT:  Head: Normocephalic and atraumatic.  Mouth/Throat: Oropharynx is clear and moist. No oropharyngeal exudate.  Oropharynx is erythematous.   There is no asymmetry.  Uvula is midline.  No exudate.  Floor mouth is soft.  Eyes: Pupils are equal, round, and reactive to light. Conjunctivae and EOM are normal.  Neck: Normal range of motion. Neck supple.  No meningismus.  Cardiovascular: Normal rate, regular rhythm, normal heart sounds and intact distal pulses.  No murmur heard. Pulmonary/Chest: Effort normal and breath sounds normal. No respiratory distress.  Abdominal: Soft. There is no tenderness. There is no rebound and no guarding.  Musculoskeletal: Normal range of motion. She exhibits no edema or tenderness.  Lymphadenopathy:    She has cervical adenopathy.  Neurological: She is alert and oriented to person, place, and time. No cranial nerve deficit. She exhibits normal muscle tone. Coordination normal.   5/5 strength throughout. CN 2-12 intact.Equal grip strength.   Skin: Skin is warm.  Psychiatric: She has a normal mood and affect. Her behavior is normal.  Nursing note and vitals reviewed.    ED Treatments / Results  Labs (all labs ordered are listed, but only abnormal results are displayed) Labs Reviewed  GROUP A STREP BY PCR - Abnormal; Notable for the following components:      Result Value   Group A Strep by PCR DETECTED (*)    All other components within normal limits    EKG None  Radiology No results found.  Procedures Procedures (including critical care time)  Medications Ordered in ED Medications - No data to display   Initial Impression / Assessment and Plan / ED Course  I have reviewed the triage vital signs and the nursing notes.  Pertinent labs & imaging results that were available during my care of the patient were reviewed by me and considered in my medical decision making (see chart for details).    1 week of sore throat.  No evidence of peritonsillar abscess or Ludwig angina on exam.  Uvula is midline.  Rapid strep test is positive.  Patient elects to be treated with IM Bicillin.  IM  Decadron given as well.  Patient tolerating p.o.  No evidence of peritonsillar abscess.  Advised antipyretics, pain control, hydration, PCP follow-up, return precautions discussed  Final Clinical Impressions(s) / ED Diagnoses   Final diagnoses:  Strep pharyngitis    ED Discharge Orders    None       Aadil Sur, Annie Main, MD 08/14/17 [phone]

## 2017-08-14 NOTE — ED Triage Notes (Signed)
Pt arrives from home c/o sore throat X 1 week. Pt states she can barely swallow and has tried various OTC medications without relief.

## 2017-08-15 ENCOUNTER — Telehealth: Payer: Self-pay

## 2017-08-15 NOTE — Telephone Encounter (Signed)
Post ED Visit - Positive Culture Follow-up  Culture report reviewed by antimicrobial stewardship pharmacist:  []  Elenor Quinones, Pharm.D. []  Heide Guile, Pharm.D., BCPS AQ-ID [x]  Parks Neptune, Pharm.D., BCPS []  Alycia Rossetti, Pharm.D., BCPS []  [place], [place].D., BCPS, AAHIVP []  Legrand Como, Pharm.D., BCPS, AAHIVP []  Salome Arnt, PharmD, BCPS []  Wynell Balloon, PharmD []  Vincenza Hews, PharmD, BCPS  Positive strep culture Treated with IM PCN, organism sensitive to the same and no further patient follow-up is required at this time.  Genia Del 08/15/2017, 11:10 AM

## 2017-08-25 ENCOUNTER — Other Ambulatory Visit: Payer: Self-pay | Admitting: Family

## 2017-08-25 DIAGNOSIS — Z1231 Encounter for screening mammogram for malignant neoplasm of breast: Secondary | ICD-10-CM

## 2017-09-13 ENCOUNTER — Ambulatory Visit
Admission: RE | Admit: 2017-09-13 | Discharge: 2017-09-13 | Disposition: A | Discharge status: Home / Self Care | Payer: BLUE CROSS/BLUE SHIELD | Source: Ambulatory Visit | Attending: Family | Admitting: Family

## 2017-09-13 DIAGNOSIS — Z1231 Encounter for screening mammogram for malignant neoplasm of breast: Secondary | ICD-10-CM

## 2017-12-02 ENCOUNTER — Ambulatory Visit: Payer: BLUE CROSS/BLUE SHIELD

## 2017-12-02 ENCOUNTER — Telehealth: Payer: Self-pay

## 2017-12-02 NOTE — Telephone Encounter (Signed)
PATIENT WAS A NO SHOW AND LETTER SENT  °

## 2017-12-02 NOTE — Telephone Encounter (Signed)
noted 

## 2018-02-16 ENCOUNTER — Emergency Department (HOSPITAL_COMMUNITY): Payer: BLUE CROSS/BLUE SHIELD

## 2018-02-16 ENCOUNTER — Ambulatory Visit (INDEPENDENT_AMBULATORY_CARE_PROVIDER_SITE_OTHER): Payer: Self-pay

## 2018-02-16 ENCOUNTER — Other Ambulatory Visit: Payer: Self-pay

## 2018-02-16 ENCOUNTER — Encounter (HOSPITAL_COMMUNITY): Payer: Self-pay | Admitting: Emergency Medicine

## 2018-02-16 ENCOUNTER — Emergency Department (HOSPITAL_COMMUNITY)
Admission: EM | Admit: 2018-02-16 | Discharge: 2018-02-16 | Disposition: A | Discharge status: Home / Self Care | Payer: BLUE CROSS/BLUE SHIELD | Attending: Emergency Medicine | Admitting: Emergency Medicine

## 2018-02-16 VITALS — BP 180/100 | HR 98 | Temp 98.1°F

## 2018-02-16 DIAGNOSIS — R1084 Generalized abdominal pain: Secondary | ICD-10-CM

## 2018-02-16 DIAGNOSIS — R079 Chest pain, unspecified: Secondary | ICD-10-CM | POA: Diagnosis present

## 2018-02-16 DIAGNOSIS — Z1211 Encounter for screening for malignant neoplasm of colon: Secondary | ICD-10-CM

## 2018-02-16 DIAGNOSIS — R112 Nausea with vomiting, unspecified: Secondary | ICD-10-CM | POA: Insufficient documentation

## 2018-02-16 DIAGNOSIS — R072 Precordial pain: Secondary | ICD-10-CM | POA: Diagnosis not present

## 2018-02-16 DIAGNOSIS — Z79899 Other long term (current) drug therapy: Secondary | ICD-10-CM | POA: Insufficient documentation

## 2018-02-16 LAB — HEPATIC FUNCTION PANEL
ALT: 12 U/L (ref 0–44)
AST: 17 U/L (ref 15–41)
Albumin: 4.2 g/dL (ref 3.5–5.0)
Alkaline Phosphatase: 52 U/L (ref 38–126)
Bilirubin, Direct: <0.1 mg/dL (ref 0.0–0.2)
Total Bilirubin: 0.6 mg/dL (ref 0.3–1.2)
Total Protein: 8.1 g/dL (ref 6.5–8.1)

## 2018-02-16 LAB — CBC
HCT: 43.3 % (ref 36.0–46.0)
Hemoglobin: 13.7 g/dL (ref 12.0–15.0)
MCH: 27.7 pg (ref 26.0–34.0)
MCHC: 31.6 g/dL (ref 30.0–36.0)
MCV: 87.7 fL (ref 80.0–100.0)
Platelets: 284 10*3/uL (ref 150–400)
RBC: 4.94 MIL/uL (ref 3.87–5.11)
RDW: 13.7 % (ref 11.5–15.5)
WBC: 5.6 10*3/uL (ref 4.0–10.5)
nRBC: 0 % (ref 0.0–0.2)

## 2018-02-16 LAB — BASIC METABOLIC PANEL
Anion gap: 7 (ref 5–15)
BUN: 8 mg/dL (ref 6–20)
CO2: 27 mmol/L (ref 22–32)
Calcium: 9.1 mg/dL (ref 8.9–10.3)
Chloride: 107 mmol/L (ref 98–111)
Creatinine, Ser: 0.59 mg/dL (ref 0.44–1.00)
GFR calc Af Amer: >60 mL/min (ref >60)
GFR calc non Af Amer: >60 mL/min (ref >60)
Glucose, Bld: 113 mg/dL — ABNORMAL HIGH (ref 70–99)
Potassium: 4.1 mmol/L (ref 3.5–5.1)
Sodium: 141 mmol/L (ref 135–145)

## 2018-02-16 LAB — I-STAT TROPONIN, ED
Troponin i, poc: 0 ng/mL (ref 0.00–0.08)
Troponin i, poc: 0 ng/mL (ref 0.00–0.08)

## 2018-02-16 LAB — TROPONIN I: Troponin I: <0.03 ng/mL (ref <0.03)

## 2018-02-16 LAB — LIPASE, BLOOD: Lipase: 34 U/L (ref 11–51)

## 2018-02-16 MED ORDER — IOPAMIDOL (ISOVUE-300) INJECTION 61%
100.0000 mL | Freq: Once | INTRAVENOUS | Status: AC | PRN
  Administered 2018-02-16: 100 mL via INTRAVENOUS

## 2018-02-16 NOTE — ED Notes (Signed)
Pt complaining of belly pain, last BM 6 days ago

## 2018-02-16 NOTE — ED Triage Notes (Signed)
Patient brought in by EMS for intermittent chest pain since yesterday. Per EMS, patient given 324 ASA and nitro x 1 with relief. Patient denies pain at triage.

## 2018-02-16 NOTE — Progress Notes (Signed)
Pt came in for a nurse visit to schedule a screening tcs. During the visit the pt stated she had an "episode" last night. She had a headache all day, was dizzy at work, started vomiting around 8pm and had abd pain all night. She also c/o pain in her chest. She said she had very little sleep last night because she was afraid to go to sleep. Pt seems confused at times during the visit. Rosendo Gros spoke with Vicente Males and pt needs to go to ED via EMS. EMS was called and they have taken the pt to the ED.

## 2018-02-16 NOTE — ED Provider Notes (Signed)
[facility] EMERGENCY DEPARTMENT Provider Note   CSN: [id number] Arrival date & time: 02/16/18  0849     History   Chief Complaint Chief Complaint  Patient presents with  . Chest Pain    HPI Tara Wolf is a 52 y.o. female.  Patient sent in from the gastroenterologist office for chest pain high blood pressure and a history of some vomiting and abdominal pain that started last evening.  Patient felt fine up until around about 8 PM.  Started to have some nausea and vomiting had multiple episodes up until 12 midnight.  Then it resolved.  Patient was seen gastroenterology to have a colonoscopy scheduled.  Patient not clear whether this was just her screening test for age 60 or whether this was related to a colonoscopy that was done 10 years ago.  Patient apparently has a history of chronic constipation.  No history of any heart disease.  Patient denies vomiting of any blood.  No diarrhea.  Abdominal pain was somewhat generalized.  Patient works evening shift.     Past Medical History:  Diagnosis Date  . Arthritis   . Chronic constipation   . GERD (gastroesophageal reflux disease)   . IBS (irritable bowel syndrome)   . Nausea and vomiting    chronic    Patient Active Problem List   Diagnosis Date Noted  . GERD 04/04/2009  . CONSTIPATION, CHRONIC 04/04/2009  . WEIGHT GAIN 04/04/2009    Past Surgical History:  Procedure Laterality Date  . ABDOMINAL HYSTERECTOMY    . BREAST BIOPSY Left    neg     OB History   None      Home Medications    Prior to Admission medications   Medication Sig Start Date End Date Taking? Authorizing Provider  pantoprazole (PROTONIX) 40 MG tablet Take by mouth.   Yes [provider]  bisacodyl (DULCOLAX) 5 MG EC tablet Take 5 mg by mouth daily as needed for moderate constipation.    [provider]    Family History Family History  Problem Relation Age of Onset  . Breast cancer Cousin   . Hypertension  Mother   . Cancer Father     Social History Social History   Tobacco Use  . Smoking status: Never Smoker  . Smokeless tobacco: Never Used  Substance Use Topics  . Alcohol use: Yes    Comment: ocassional  . Drug use: No     Allergies   Patient has no known allergies.   Review of Systems Review of Systems  Constitutional: Negative for fever.  HENT: Negative for congestion.   Eyes: Negative for visual disturbance.  Respiratory: Negative for shortness of breath.   Cardiovascular: Positive for chest pain. Negative for leg swelling.  Gastrointestinal: Positive for abdominal pain, constipation, nausea and vomiting. Negative for diarrhea.  Genitourinary: Negative for dysuria.  Musculoskeletal: Positive for myalgias.  Skin: Negative for rash.  Neurological: Negative for headaches.  Hematological: Does not bruise/bleed easily.  Psychiatric/Behavioral: Negative for confusion.     Physical Exam Updated Vital Signs BP 105/83   Pulse (!) 53   Temp 97.9 F (36.6 C) (Oral)   Resp 13   Ht 1.753 m (5\' 9" )   Wt 88.5 kg   SpO2 100%   BMI 28.80 kg/m   Physical Exam  Constitutional: She is oriented to person, place, and time. She appears well-developed and well-nourished. No distress.  HENT:  Head: Normocephalic and atraumatic.  Mucous membranes slightly dry.  Eyes:  Pupils are equal, round, and reactive to light. Conjunctivae and EOM are normal.  Neck: Normal range of motion. Neck supple.  Cardiovascular: Normal rate, regular rhythm and normal heart sounds.  Pulmonary/Chest: Effort normal and breath sounds normal.  Abdominal: Soft. Bowel sounds are normal. There is no tenderness.  Musculoskeletal: Normal range of motion. She exhibits no edema.  Neurological: She is alert and oriented to person, place, and time. No cranial nerve deficit or sensory deficit. She exhibits normal muscle tone. Coordination normal.  Skin: Skin is warm. No rash noted.  Nursing note and vitals  reviewed.    ED Treatments / Results  Labs (all labs ordered are listed, but only abnormal results are displayed) Labs Reviewed  BASIC METABOLIC PANEL - Abnormal; Notable for the following components:      Result Value   Glucose, Bld 113 (*)    All other components within normal limits  CBC  TROPONIN I  HEPATIC FUNCTION PANEL  LIPASE, BLOOD  I-STAT TROPONIN, ED  I-STAT TROPONIN, ED    EKG EKG Interpretation  Date/Time:  Wednesday February 16 2018 08:53:43 EST Ventricular Rate:  55 PR Interval:    QRS Duration: 96 QT Interval:  429 QTC Calculation: 411 R Axis:   15 Text Interpretation:  Sinus rhythm Low voltage, precordial leads Abnormal R-wave progression, early transition Confirmed by Fredia Sorrow [id number]) on 02/16/2018 9:09:01 AM   Radiology Dg Chest 2 View  Result Date: 02/16/2018 CLINICAL DATA:  Chest pain.  Hypertension.  Headache. EXAM: CHEST - 2 VIEW COMPARISON:  09/26/2015 FINDINGS: The heart size and mediastinal contours are within normal limits. Both lungs are clear. The visualized skeletal structures are unremarkable. IMPRESSION: Normal exam. Electronically Signed   By: Lorriane Shire M.D.   On: 02/16/2018 09:52   Ct Abdomen Pelvis W Contrast  Result Date: 02/16/2018 CLINICAL DATA:  High-grade bowel obstruction.  Abdominal pain EXAM: CT ABDOMEN AND PELVIS WITH CONTRAST TECHNIQUE: Multidetector CT imaging of the abdomen and pelvis was performed using the standard protocol following bolus administration of intravenous contrast. CONTRAST:  169mL ISOVUE-300 IOPAMIDOL (ISOVUE-300) INJECTION 61% COMPARISON:  CT abdomen pelvis 09/26/2015 FINDINGS: Lower chest: No acute abnormality. Hepatobiliary: 3 cm lesion in the posterior right lobe liver is unchanged. This has peripheral enhancement and fills in on delayed imaging. This is consistent with hemangioma. No other liver lesion. Gallbladder and bile ducts normal Pancreas: Negative Spleen: Negative Adrenals/Urinary Tract:  Adrenal glands are unremarkable. Kidneys are normal, without renal calculi, focal lesion, or hydronephrosis. Bladder is unremarkable. Stomach/Bowel: Stomach is within normal limits. Appendix appears normal. No evidence of bowel wall thickening, distention, or inflammatory changes. Vascular/Lymphatic: No significant vascular findings are present. No enlarged abdominal or pelvic lymph nodes. Reproductive: Hysterectomy.  3 cm left adnexal cyst Other: No free fluid. Musculoskeletal: Negative IMPRESSION: No acute abnormality. Normal appendix. Negative for bowel obstruction 3 cm liver lesion is stable from 2017 and consistent with hemangioma. Electronically Signed   By: Franchot Gallo M.D.   On: 02/16/2018 12:46    Procedures Procedures (including critical care time)  Medications Ordered in ED Medications  iopamidol (ISOVUE-300) 61 % injection 100 mL (100 mLs Intravenous Contrast Given 02/16/18 1229)     Initial Impression / Assessment and Plan / ED Course  I have reviewed the triage vital signs and the nursing notes.  Pertinent labs & imaging results that were available during my care of the patient were reviewed by me and considered in my medical decision making (see chart for details).  Patient without any further vomiting since midnight.  But still not feeling well.  Patient states she has not had a bowel movement in about a week or so.  Patient denies any vomiting of blood.  Still has some body aches.  Chest pain was central part of the chest.  Nonradiating.  Troponins x2 were negative EKG without acute findings.  Chest x-ray and CT of abdomen pelvis without any acute abnormalities.  Basic labs without significant abnormalities.  Patient without evidence of any significant constipation as well on CT scan.  Feel that symptoms could have been related to a viral gastritis.  Patient is stable for discharge home follow back up with gastroenterology to reschedule the colonoscopy.  Patient without any  further nausea or vomiting.  Patient will be given work note for tonight.  Patient will return for any new or worse symptoms.  In addition there was concern about hypertension.  Patient first arrived blood pressure was elevated but it came down without any treatment here and has normal blood pressures at the time of discharge.   Final Clinical Impressions(s) / ED Diagnoses   Final diagnoses:  Precordial pain  Generalized abdominal pain  Non-intractable vomiting with nausea, unspecified vomiting type    ED Discharge Orders    None       Fredia Sorrow, MD 02/16/18 1430

## 2018-02-16 NOTE — Progress Notes (Signed)
Agree. BP was elevated. EMS was called. Taken in stable condition to ED.

## 2018-02-16 NOTE — Discharge Instructions (Addendum)
Work note provided.  Return for any new or worse symptoms.  Today's work-up included cardiac rule out no evidence of any heart attack.  Also CT scan of abdomen pelvis showed no significant constipation no significant abnormalities.  Suspect that the vomiting abdominal pain may have been related to a viral illness.  Reschedule your appointment with gastroenterology for the planned colonoscopy.

## 2018-04-19 ENCOUNTER — Encounter: Payer: Self-pay | Admitting: Gastroenterology

## 2018-04-19 ENCOUNTER — Other Ambulatory Visit: Payer: Self-pay

## 2018-04-19 ENCOUNTER — Ambulatory Visit: Payer: BLUE CROSS/BLUE SHIELD | Admitting: Gastroenterology

## 2018-04-19 VITALS — BP 127/73 | HR 68 | Temp 97.0°F | Ht 69.0 in | Wt 187.0 lb

## 2018-04-19 DIAGNOSIS — Z800 Family history of malignant neoplasm of digestive organs: Secondary | ICD-10-CM | POA: Diagnosis not present

## 2018-04-19 DIAGNOSIS — K5909 Other constipation: Secondary | ICD-10-CM | POA: Diagnosis not present

## 2018-04-19 MED ORDER — NA SULFATE-K SULFATE-MG SULF 17.5-3.13-1.6 GM/177ML PO SOLN: 1.0000 | ORAL | 0 refills | Status: DC

## 2018-04-19 MED ORDER — LINACLOTIDE 145 MCG PO CAPS: 145.0000 ug | ORAL_CAPSULE | Freq: Every day | ORAL | 5 refills | Status: DC

## 2018-04-19 NOTE — Assessment & Plan Note (Signed)
Colonoscopy in the near future.  I have discussed the risks, alternatives, benefits with regards to but not limited to the risk of reaction to medication, bleeding, infection, perforation and the patient is agreeable to proceed. Written consent to be obtained.

## 2018-04-19 NOTE — Progress Notes (Signed)
Primary Care Physician:  Joyice Faster, FNP  Primary Gastroenterologist:  Barney Drain, MD   Chief Complaint  Patient presents with  . Abdominal Pain    comes/goes, rlq and middle abd  . Nausea    no vomiting    HPI:  Tara Wolf is a 53 y.o. female here to schedule colonoscopy and follow-up on abdominal pain.  She presented back in November for nurse visit to schedule screening colonoscopy.  Patient had reported an episode occurring the night before, day long headache, dizziness, vomiting and abdominal pain as well as chest pain.  Patient seems somewhat confused during her visit.  Blood pressure was 180/100.  EMS was called and patient was taken to the ED.  In the ED her EKG was without acute findings.  Chest x-ray and CT without acute abnormalities.  Basic labs unremarkable.  Troponins negative x2.  Blood pressure came down without any intervention.  It was felt that she may have had acute viral illness.  Patient states that overall she been doing much better.  Only complaint is chronic constipation.  She had been taken Dulcolax 5 mg daily for a while.  Causes of abdominal cramping and not effective.  Maybe has 1 bowel movement per week.  She started fiber supplement several days ago but no significant improvement in symptoms.  Denies melena or rectal bleeding.  For the most part her heartburn is well controlled.  Occasional nocturnal symptoms.  Was bloated.  No dysphagia.  No further vomiting.  Family history significant for colon cancer, father age less than 25.  Patient's last colonoscopy was December 2010, she had a tortuous colon but otherwise unremarkable.  EGD at the same time with moderate sized sliding hiatal hernia, multiple erosions in the antrum.  Small bowel biopsy negative for celiac.  Current Outpatient Medications  Medication Sig Dispense Refill  . bisacodyl (DULCOLAX) 5 MG EC tablet Take 5 mg by mouth daily as needed for moderate constipation.    .  pantoprazole (PROTONIX) 40 MG tablet Take by mouth.     No current facility-administered medications for this visit.     Allergies as of 04/19/2018  . (No Known Allergies)    Past Medical History:  Diagnosis Date  . Arthritis   . Chronic constipation   . GERD (gastroesophageal reflux disease)   . IBS (irritable bowel syndrome)   . Nausea and vomiting    chronic    Past Surgical History:  Procedure Laterality Date  . ABDOMINAL HYSTERECTOMY    . BREAST BIOPSY Left    neg  . COLONOSCOPY WITH ESOPHAGOGASTRODUODENOSCOPY (EGD)  03/2009   Dr. Oneida Alar: Tortuous colon.  Otherwise normal colon.  Moderate sized sliding hiatal hernia.  Multiple erosions in the antrum.  Small bowel biopsies negative for celiac disease.    Family History  Problem Relation Age of Onset  . Breast cancer Cousin   . Hypertension Mother   . Cancer Father        colon cancer, age 22  . Cancer Sister        patient doesn't know what kind  . Cancer Brother        not sure primary    Social History   Socioeconomic History  . Marital status: Single    Spouse name: Not on file  . Number of children: Not on file  . Years of education: Not on file  . Highest education level: Not on file  Occupational History  . Not on file  Social Needs  . Financial resource strain: Not on file  . Food insecurity:    Worry: Not on file    Inability: Not on file  . Transportation needs:    Medical: Not on file    Non-medical: Not on file  Tobacco Use  . Smoking status: Never Smoker  . Smokeless tobacco: Never Used  Substance and Sexual Activity  . Alcohol use: Yes    Comment: ocassional  . Drug use: No  . Sexual activity: Not on file  Lifestyle  . Physical activity:    Days per week: Not on file    Minutes per session: Not on file  . Stress: Not on file  Relationships  . Social connections:    Talks on phone: Not on file    Gets together: Not on file    Attends religious service: Not on file    Active  member of club or organization: Not on file    Attends meetings of clubs or organizations: Not on file    Relationship status: Not on file  . Intimate partner violence:    Fear of current or ex partner: Not on file    Emotionally abused: Not on file    Physically abused: Not on file    Forced sexual activity: Not on file  Other Topics Concern  . Not on file  Social History Narrative  . Not on file      ROS:  General: Negative for anorexia, weight loss, fever, chills, fatigue, weakness. Eyes: Negative for vision changes.  ENT: Negative for hoarseness, difficulty swallowing , nasal congestion. CV: Negative for chest pain, angina, palpitations, dyspnea on exertion, peripheral edema.  Respiratory: Negative for dyspnea at rest, dyspnea on exertion, cough, sputum, wheezing.  GI: See history of present illness. GU:  Negative for dysuria, hematuria, urinary incontinence, urinary frequency, nocturnal urination.  MS: Negative for joint pain, low back pain.  Derm: Negative for rash or itching.  Neuro: Negative for weakness, abnormal sensation, seizure, frequent headaches, memory loss, confusion.  Psych: Negative for anxiety, depression, suicidal ideation, hallucinations.  Endo: Negative for unusual weight change.  Heme: Negative for bruising or bleeding. Allergy: Negative for rash or hives.    Physical Examination:  BP 127/73   Pulse 68   Temp (!) 97 F (36.1 C) (Oral)   Ht 5\' 9"  (1.753 m)   Wt 187 lb (84.8 kg)   BMI 27.62 kg/m    General: Well-nourished, well-developed in no acute distress.  Head: Normocephalic, atraumatic.   Eyes: Conjunctiva pink, no icterus. Mouth: Oropharyngeal mucosa moist and pink , no lesions erythema or exudate. Neck: Supple without thyromegaly, masses, or lymphadenopathy.  Lungs: Clear to auscultation bilaterally.  Heart: Regular rate and rhythm, no murmurs rubs or gallops.  Abdomen: Bowel sounds are normal, nontender, nondistended, no  hepatosplenomegaly or masses, no abdominal bruits or    hernia , no rebound or guarding.   Rectal: Not performed Extremities: No lower extremity edema. No clubbing or deformities.  Neuro: Alert and oriented x 4 , grossly normal neurologically.  Skin: Warm and dry, no rash or jaundice.   Psych: Alert and cooperative, normal mood and affect.  Labs: Lab Results  Component Value Date   CREATININE 0.59 02/16/2018   BUN 8 02/16/2018   NA 141 02/16/2018   K 4.1 02/16/2018   CL 107 02/16/2018   CO2 27 02/16/2018   Lab Results  Component Value Date   ALT 12 02/16/2018   AST 17 02/16/2018  ALKPHOS 52 02/16/2018   BILITOT 0.6 02/16/2018   Lab Results  Component Value Date   WBC 5.6 02/16/2018   HGB 13.7 02/16/2018   HCT 43.3 02/16/2018   MCV 87.7 02/16/2018   PLT 284 02/16/2018   Lab Results  Component Value Date   LIPASE 34 02/16/2018     Imaging Studies: No results found.

## 2018-04-19 NOTE — Progress Notes (Signed)
CC'ED TO PCP 

## 2018-04-19 NOTE — Patient Instructions (Signed)
1. Start Linzess 145 mcg daily may need the stomach for constipation.  If the medication is too strong or not strong enough, let me know as we have multiple strengths that we can give you.  OUR GOAL WOULD BE 3-4 SOFT BOWEL MOVEMENTS WEEKLY. If the medication works well, continue daily, prescription sent to [facility] on [address]. 2. Colonoscopy as scheduled.  Please see separate instructions.

## 2018-04-19 NOTE — Assessment & Plan Note (Signed)
Begin Linzess 145 mcg daily on an empty stomach.  Samples and prescription provided.  Patient instructed to let us know if we need to make any dose adjustments.  Goal of 3-4 soft bowel movements per week.

## 2018-06-14 ENCOUNTER — Telehealth: Payer: Self-pay | Admitting: Gastroenterology

## 2018-06-14 MED ORDER — LUBIPROSTONE 24 MCG PO CAPS: 24.0000 ug | ORAL_CAPSULE | Freq: Two times a day (BID) | ORAL | 5 refills | Status: DC

## 2018-06-14 MED ORDER — NA SULFATE-K SULFATE-MG SULF 17.5-3.13-1.6 GM/177ML PO SOLN: 1.0000 | ORAL | 0 refills | Status: DC

## 2018-06-14 NOTE — Telephone Encounter (Signed)
rx for amitiza sent. 

## 2018-06-14 NOTE — Telephone Encounter (Signed)
Pt said the Linzess Rx was $500 and she needed something else. She said the pharmacy told her that didn't have her prep. I told her it had been sent back in January, but she said the pharmacist told her they only hold it for 9 day. She uses [facility] on [address]. Please advise [phone]

## 2018-06-14 NOTE — Telephone Encounter (Signed)
PT is aware.

## 2018-06-14 NOTE — Telephone Encounter (Signed)
I called Amonty at the pharmacy. She said pt needs another prescription in place of it, it does not need a PA.  Also, she said they have the Suprep on profile.  Pt is aware.

## 2018-06-14 NOTE — Telephone Encounter (Signed)
New rx sent in for suprep

## 2018-06-14 NOTE — Telephone Encounter (Signed)
Forwarding to Leslie Lewis, PA to advise! 

## 2018-06-23 ENCOUNTER — Encounter (HOSPITAL_COMMUNITY): Payer: Self-pay | Admitting: *Deleted

## 2018-06-23 ENCOUNTER — Ambulatory Visit (HOSPITAL_COMMUNITY)
Admission: RE | Admit: 2018-06-23 | Discharge: 2018-06-23 | Disposition: A | Discharge status: Home / Self Care | Payer: BLUE CROSS/BLUE SHIELD | Attending: Gastroenterology | Admitting: Gastroenterology

## 2018-06-23 ENCOUNTER — Other Ambulatory Visit: Payer: Self-pay

## 2018-06-23 ENCOUNTER — Encounter (HOSPITAL_COMMUNITY)
Admission: RE | Disposition: A | Discharge status: Home / Self Care | Payer: Self-pay | Source: Home / Self Care | Attending: Gastroenterology

## 2018-06-23 DIAGNOSIS — D123 Benign neoplasm of transverse colon: Secondary | ICD-10-CM | POA: Diagnosis not present

## 2018-06-23 DIAGNOSIS — Z90710 Acquired absence of both cervix and uterus: Secondary | ICD-10-CM | POA: Insufficient documentation

## 2018-06-23 DIAGNOSIS — Z803 Family history of malignant neoplasm of breast: Secondary | ICD-10-CM | POA: Diagnosis not present

## 2018-06-23 DIAGNOSIS — K219 Gastro-esophageal reflux disease without esophagitis: Secondary | ICD-10-CM | POA: Insufficient documentation

## 2018-06-23 DIAGNOSIS — Q438 Other specified congenital malformations of intestine: Secondary | ICD-10-CM | POA: Diagnosis not present

## 2018-06-23 DIAGNOSIS — K644 Residual hemorrhoidal skin tags: Secondary | ICD-10-CM | POA: Insufficient documentation

## 2018-06-23 DIAGNOSIS — Z79899 Other long term (current) drug therapy: Secondary | ICD-10-CM | POA: Diagnosis not present

## 2018-06-23 DIAGNOSIS — Z8249 Family history of ischemic heart disease and other diseases of the circulatory system: Secondary | ICD-10-CM | POA: Diagnosis not present

## 2018-06-23 DIAGNOSIS — M1990 Unspecified osteoarthritis, unspecified site: Secondary | ICD-10-CM | POA: Diagnosis not present

## 2018-06-23 DIAGNOSIS — K449 Diaphragmatic hernia without obstruction or gangrene: Secondary | ICD-10-CM | POA: Insufficient documentation

## 2018-06-23 DIAGNOSIS — Z1211 Encounter for screening for malignant neoplasm of colon: Secondary | ICD-10-CM | POA: Diagnosis present

## 2018-06-23 DIAGNOSIS — K648 Other hemorrhoids: Secondary | ICD-10-CM

## 2018-06-23 DIAGNOSIS — Z800 Family history of malignant neoplasm of digestive organs: Secondary | ICD-10-CM

## 2018-06-23 DIAGNOSIS — K581 Irritable bowel syndrome with constipation: Secondary | ICD-10-CM | POA: Insufficient documentation

## 2018-06-23 DIAGNOSIS — Z809 Family history of malignant neoplasm, unspecified: Secondary | ICD-10-CM | POA: Diagnosis not present

## 2018-06-23 HISTORY — PX: COLONOSCOPY: SHX5424

## 2018-06-23 HISTORY — PX: POLYPECTOMY: SHX5525

## 2018-06-23 SURGERY — COLONOSCOPY
Anesthesia: Moderate Sedation

## 2018-06-23 MED ORDER — STERILE WATER FOR IRRIGATION IR SOLN
Status: DC | PRN
  Administered 2018-06-23: 100 mL

## 2018-06-23 MED ORDER — MIDAZOLAM HCL 5 MG/5ML IJ SOLN
INTRAMUSCULAR | Status: AC
  Filled 2018-06-23: qty 10

## 2018-06-23 MED ORDER — SODIUM CHLORIDE 0.9 % IV SOLN
INTRAVENOUS | Status: DC
  Administered 2018-06-23: 12:00:00 via INTRAVENOUS

## 2018-06-23 MED ORDER — MEPERIDINE HCL 100 MG/ML IJ SOLN
INTRAMUSCULAR | Status: AC
  Filled 2018-06-23: qty 2

## 2018-06-23 MED ORDER — MIDAZOLAM HCL 5 MG/5ML IJ SOLN
INTRAMUSCULAR | Status: DC | PRN
  Administered 2018-06-23 (×2): 2 mg via INTRAVENOUS

## 2018-06-23 MED ORDER — MEPERIDINE HCL 100 MG/ML IJ SOLN
INTRAMUSCULAR | Status: DC | PRN
  Administered 2018-06-23 (×2): 25 mg via INTRAVENOUS

## 2018-06-23 NOTE — H&P (Signed)
Primary Care Physician:  Joyice Faster, FNP Primary Gastroenterologist:  Dr. Oneida Alar  Pre-Procedure History & Physical: HPI:  Tara Wolf is a 53 y.o. female here for [facility].  Past Medical History:  Diagnosis Date  . Arthritis   . Chronic constipation   . GERD (gastroesophageal reflux disease)   . IBS (irritable bowel syndrome)   . Nausea and vomiting    chronic    Past Surgical History:  Procedure Laterality Date  . ABDOMINAL HYSTERECTOMY    . BREAST BIOPSY Left    neg  . COLONOSCOPY WITH ESOPHAGOGASTRODUODENOSCOPY (EGD)  03/2009   Dr. Oneida Alar: Tortuous colon.  Otherwise normal colon.  Moderate sized sliding hiatal hernia.  Multiple erosions in the antrum.  Small bowel biopsies negative for celiac disease.    Prior to Admission medications   Medication Sig Start Date End Date Taking? Authorizing Provider  bisacodyl (DULCOLAX) 5 MG EC tablet Take 5 mg by mouth daily as needed for moderate constipation.   Yes [provider]  Na Sulfate-K Sulfate-Mg Sulf (SUPREP BOWEL PREP KIT) 17.5-3.13-1.6 GM/177ML SOLN Take 1 kit by mouth as directed. 04/19/18  Yes Weltha Cathy L, MD  pantoprazole (PROTONIX) 40 MG tablet Take by mouth.   Yes [provider]  lubiprostone (AMITIZA) 24 MCG capsule Take 1 capsule (24 mcg total) by mouth 2 (two) times daily with a meal. 06/14/18   Mahala Menghini, PA-C  Na Sulfate-K Sulfate-Mg Sulf 17.5-3.13-1.6 GM/177ML SOLN Take 1 kit by mouth as directed. 06/14/18   Danie Binder, MD    Allergies as of 04/19/2018  . (No Known Allergies)    Family History  Problem Relation Age of Onset  . Breast cancer Cousin   . Hypertension Mother   . Cancer Father        colon cancer, age 72  . Cancer Sister        patient doesn't know what kind  . Cancer Brother        not sure primary    Social History   Socioeconomic History  . Marital status: Single    Spouse name: Not on file  . Number of children: Not on  file  . Years of education: Not on file  . Highest education level: Not on file  Occupational History  . Not on file  Social Needs  . Financial resource strain: Not on file  . Food insecurity:    Worry: Not on file    Inability: Not on file  . Transportation needs:    Medical: Not on file    Non-medical: Not on file  Tobacco Use  . Smoking status: Never Smoker  . Smokeless tobacco: Never Used  Substance and Sexual Activity  . Alcohol use: Yes    Comment: ocassional  . Drug use: No  . Sexual activity: Not on file  Lifestyle  . Physical activity:    Days per week: Not on file    Minutes per session: Not on file  . Stress: Not on file  Relationships  . Social connections:    Talks on phone: Not on file    Gets together: Not on file    Attends religious service: Not on file    Active member of club or organization: Not on file    Attends meetings of clubs or organizations: Not on file    Relationship status: Not on file  . Intimate partner violence:    Fear of current or ex partner: Not  on file    Emotionally abused: Not on file    Physically abused: Not on file    Forced sexual activity: Not on file  Other Topics Concern  . Not on file  Social History Narrative  . Not on file    Review of Systems: See HPI, otherwise negative ROS   Physical Exam: BP 131/70   Pulse 65   Temp 97.7 F (36.5 C) (Oral)   Resp 18   Ht _0  (1.753 m)   Wt 83.5 kg   SpO2 100%   BMI 27.17 kg/m  General:   Alert,  pleasant and cooperative in NAD Head:  Normocephalic and atraumatic. Neck:  Supple; Lungs:  Clear throughout to auscultation.    Heart:  Regular rate and rhythm. Abdomen:  Soft, nontender and nondistended. Normal bowel sounds, without guarding, and without rebound.   Neurologic:  Alert and  oriented x4;  grossly normal neurologically.  Impression/Plan:    SCREENING  Plan:  1. TCS TODAY DISCUSSED PROCEDURE, BENEFITS, & RISKS: < 1% chance of medication reaction,  bleeding, perforation, ASPIRATION, or rupture of spleen/liver requiring surgery to fix it and missed polyps < 1 cm 10-20% of the time.

## 2018-06-23 NOTE — Op Note (Signed)
[facility] Patient Name: Tara Wolf Procedure Date: 06/23/2018 1:30 PM MRN: [id number] Date of Birth: [birth year] Attending MD: Barney Drain MD, MD CSN: [id number] Age: 53 Admit Type: Outpatient Procedure:                Colonoscopy WITH COLD SNARE POLYPECTOMY Indications:              Screening in patient at increased risk: Colorectal                            cancer in father before age 62 Providers:                Barney Drain MD, MD, Rosina Lowenstein, RN, Raphael Gibney, Technician, Randa Spike, Technician Referring MD:             Burman Freestone FNP, NP Medicines:                Meperidine 50 mg IV, Midazolam 5 mg IV Complications:            No immediate complications. Estimated Blood Loss:     Estimated blood loss was minimal. Procedure:                Pre-Anesthesia Assessment:                           - Prior to the procedure, a History and Physical                            was performed, and patient medications and                            allergies were reviewed. The patient's tolerance of                            previous anesthesia was also reviewed. The risks                            and benefits of the procedure and the sedation                            options and risks were discussed with the patient.                            All questions were answered, and informed consent                            was obtained. Prior Anticoagulants: The patient has                            taken no previous anticoagulant or antiplatelet                            agents. ASA Grade Assessment: I - A normal, healthy  patient. After reviewing the risks and benefits,                            the patient was deemed in satisfactory condition to                            undergo the procedure. After obtaining informed                            consent, the colonoscope was passed under direct                  vision. Throughout the procedure, the patient's                            blood pressure, pulse, and oxygen saturations were                            monitored continuously. The PCF-[id number] ([id number])                            scope was introduced through the anus and advanced                            to the the cecum, identified by appendiceal orifice                            and ileocecal valve. The colonoscopy was somewhat                            difficult due to a tortuous colon. Successful                            completion of the procedure was aided by                            straightening and shortening the scope to obtain                            bowel loop reduction and COLOWRAP. The patient                            tolerated the procedure well. The quality of the                            bowel preparation was excellent. The ileocecal                            valve, appendiceal orifice, and rectum were                            photographed. Scope In: 1:43:54 PM Scope Out: 1:57:43 PM Scope Withdrawal Time: 0 hours 11 minutes 48 seconds  Total Procedure Duration: 0 hours 13 minutes 49 seconds  Findings:  A 4 mm polyp was found in the distal transverse colon. The polyp was       sessile. The polyp was removed with a cold snare. Resection and       retrieval were complete.      External hemorrhoids were found.      The recto-sigmoid colon and sigmoid colon were mildly tortuous. Impression:               - One 4 mm polyp in the distal transverse colon,                            removed with a cold snare. Resected and retrieved.                           - External hemorrhoids.                           - Tortuous left colon. Moderate Sedation:      Moderate (conscious) sedation was administered by the endoscopy nurse       and supervised by the endoscopist. The following parameters were       monitored: oxygen saturation, heart rate,  blood pressure, and response       to care. Total physician intraservice time was 26 minutes. Recommendation:           - Patient has a contact number available for                            emergencies. The signs and symptoms of potential                            delayed complications were discussed with the                            patient. Return to normal activities tomorrow.                            Written discharge instructions were provided to the                            patient.                           - High fiber diet.                           - Continue present medications.                           - Await pathology results.                           - Repeat colonoscopy in 5 years for surveillance. Procedure Code(s):        --- Professional ---                           [phone], Colonoscopy, flexible; with removal of  tumor(s), polyp(s), or other lesion(s) by snare                            technique                           99153, Moderate sedation; each additional 15                            minutes intraservice time                           G0500, Moderate sedation services provided by the                            same physician or other qualified health care                            professional performing a gastrointestinal                            endoscopic service that sedation supports,                            requiring the presence of an independent trained                            observer to assist in the monitoring of the                            patient's level of consciousness and physiological                            status; initial 15 minutes of intra-service time;                            patient age 2 years or older (additional time may                            be reported with [phone], as appropriate) Diagnosis Code(s):        --- Professional ---                           Z80.0, Family history  of malignant neoplasm of                            digestive organs                           D12.3, Benign neoplasm of transverse colon (hepatic                            flexure or splenic flexure)                           K64.8, Other hemorrhoids  Q43.8, Other specified congenital malformations of                            intestine CPT copyright 2018 [employer]. All rights reserved. The codes documented in this report are preliminary and upon coder review may  be revised to meet current compliance requirements. Barney Drain, MD Barney Drain MD, MD 06/23/2018 2:15:29 PM This report has been signed electronically. Number of Addenda: 0

## 2018-06-23 NOTE — Discharge Instructions (Signed)
You had 1 small polyp removed. You have internal and external hemorrhoids.   DRINK WATER TO KEEP YOUR URINE LIGHT YELLOW.  FOLLOW A HIGH FIBER DIET. AVOID ITEMS THAT CAUSE BLOATING & GAS. SEE INFO BELOW.  YOUR BIOPSY RESULTS WILL BE BACK IN 5 BUSINESS DAYS.  Next colonoscopy in 2025.    Colonoscopy Care After Read the instructions outlined below and refer to this sheet in the next week. These discharge instructions provide you with general information on caring for yourself after you leave the hospital. While your treatment has been planned according to the most current medical practices available, unavoidable complications occasionally occur. If you have any problems or questions after discharge, call DR. Brenlee Koskela, [phone].  ACTIVITY  You may resume your regular activity, but move at a slower pace for the next 24 hours.   Take frequent rest periods for the next 24 hours.   Walking will help get rid of the air and reduce the bloated feeling in your belly (abdomen).   No driving for 24 hours (because of the medicine (anesthesia) used during the test).   You may shower.   Do not sign any important legal documents or operate any machinery for 24 hours (because of the anesthesia used during the test).    NUTRITION  Drink plenty of fluids.   You may resume your normal diet as instructed by your doctor.   Begin with a light meal and progress to your normal diet. Heavy or fried foods are harder to digest and may make you feel sick to your stomach (nauseated).   Avoid alcoholic beverages for 24 hours or as instructed.    MEDICATIONS  You may resume your normal medications.   WHAT YOU CAN EXPECT TODAY  Some feelings of bloating in the abdomen.   Passage of more gas than usual.   Spotting of blood in your stool or on the toilet paper  .  IF YOU HAD POLYPS REMOVED DURING THE COLONOSCOPY:  Eat a soft diet IF YOU HAVE NAUSEA, BLOATING, ABDOMINAL PAIN, OR VOMITING.      FINDING OUT THE RESULTS OF YOUR TEST Not all test results are available during your visit. DR. Oneida Alar WILL CALL YOU WITHIN 14 DAYS OF YOUR PROCEDUE WITH YOUR RESULTS. Do not assume everything is normal if you have not heard from DR. Tayvia Faughnan, CALL HER OFFICE AT [phone].  SEEK IMMEDIATE MEDICAL ATTENTION AND CALL THE OFFICE: [phone] IF:  You have more than a spotting of blood in your stool.   Your belly is swollen (abdominal distention).   You are nauseated or vomiting.   You have a temperature over 101F.   You have abdominal pain or discomfort that is severe or gets worse throughout the day.   High-Fiber Diet A high-fiber diet changes your normal diet to include more whole grains, legumes, fruits, and vegetables. Changes in the diet involve replacing refined carbohydrates with unrefined foods. The calorie level of the diet is essentially unchanged. The Dietary Reference Intake (recommended amount) for adult males is 38 grams per day. For adult females, it is 25 grams per day. Pregnant and lactating women should consume 28 grams of fiber per day. Fiber is the intact part of a plant that is not broken down during digestion. Functional fiber is fiber that has been isolated from the plant to provide a beneficial effect in the body.  PURPOSE  Increase stool bulk.   Ease and regulate bowel movements.   Lower cholesterol.   REDUCE RISK  OF COLON CANCER  INDICATIONS THAT YOU NEED MORE FIBER  Constipation and hemorrhoids.   Uncomplicated diverticulosis (intestine condition) and irritable bowel syndrome.   Weight management.   As a protective measure against hardening of the arteries (atherosclerosis), diabetes, and cancer.   GUIDELINES FOR INCREASING FIBER IN THE DIET  Start adding fiber to the diet slowly. A gradual increase of about 5 more grams (2 slices of whole-wheat bread, 2 servings of most fruits or vegetables, or 1 bowl of high-fiber cereal) per day is best. Too  rapid an increase in fiber may result in constipation, flatulence, and bloating.   Drink enough water and fluids to keep your urine clear or pale yellow. Water, juice, or caffeine-free drinks are recommended. Not drinking enough fluid may cause constipation.   Eat a variety of high-fiber foods rather than one type of fiber.   Try to increase your intake of fiber through using high-fiber foods rather than fiber pills or supplements that contain small amounts of fiber.   The goal is to change the types of food eaten. Do not supplement your present diet with high-fiber foods, but replace foods in your present diet.   INCLUDE A VARIETY OF FIBER SOURCES  Replace refined and processed grains with whole grains, canned fruits with fresh fruits, and incorporate other fiber sources. White rice, white breads, and most bakery goods contain little or no fiber.   Brown whole-grain rice, buckwheat oats, and many fruits and vegetables are all good sources of fiber. These include: broccoli, Brussels sprouts, cabbage, cauliflower, beets, sweet potatoes, white potatoes (skin on), carrots, tomatoes, eggplant, squash, berries, fresh fruits, and dried fruits.   Cereals appear to be the richest source of fiber. Cereal fiber is found in whole grains and bran. Bran is the fiber-rich outer coat of cereal grain, which is largely removed in refining. In whole-grain cereals, the bran remains. In breakfast cereals, the largest amount of fiber is found in those with "bran" in their names. The fiber content is sometimes indicated on the label.   You may need to include additional fruits and vegetables each day.   In baking, for 1 cup white flour, you may use the following substitutions:   1 cup whole-wheat flour minus 2 tablespoons.   1/2 cup white flour plus 1/2 cup whole-wheat flour.   Polyps, Colon  A polyp is extra tissue that grows inside your body. Colon polyps grow in the large intestine. The large intestine, also  called the colon, is part of your digestive system. It is a long, hollow tube at the end of your digestive tract where your body makes and stores stool. Most polyps are not dangerous. They are benign. This means they are not cancerous. But over time, some types of polyps can turn into cancer. Polyps that are smaller than a pea are usually not harmful. But larger polyps could someday become or may already be cancerous. To be safe, doctors remove all polyps and test them.   WHO GETS POLYPS? Anyone can get polyps, but certain people are more likely than others. You may have a greater chance of getting polyps if:  You are over 50.   You have had polyps before.   Someone in your family has had polyps.   Someone in your family has had cancer of the large intestine.   Find out if someone in your family has had polyps. You may also be more likely to get polyps if you:   Eat a lot of  fatty foods   Smoke   Drink alcohol   Do not exercise  Eat too much   PREVENTION There is not one sure way to prevent polyps. You might be able to lower your risk of getting them if you:  Eat more fruits and vegetables and less fatty food.   Do not smoke.   Avoid alcohol.   Exercise every day.   Lose weight if you are overweight.   Eating more calcium and folate can also lower your risk of getting polyps. Some foods that are rich in calcium are milk, cheese, and broccoli. Some foods that are rich in folate are chickpeas, kidney beans, and spinach.

## 2018-06-27 ENCOUNTER — Telehealth: Payer: Self-pay

## 2018-06-27 NOTE — Telephone Encounter (Signed)
PA denied for the Amitza.   Pt must try Trulance and fail first.  In addition this medication is approved for members who have tried at least two standard laxative medication classes such as stimulant laxatives, stool softeners, enemas.   In this case pt has only tried the stool softner.  Tara Wolf, please advise!

## 2018-06-28 MED ORDER — PLECANATIDE 3 MG PO TABS: 3.0000 mg | ORAL_TABLET | Freq: Every day | ORAL | 11 refills | Status: DC

## 2018-06-28 NOTE — Telephone Encounter (Signed)
LMOM to call.

## 2018-06-28 NOTE — Telephone Encounter (Signed)
Patient has tried stimulant (bisacodyl) per [facility] med list.   Will send in trulance.

## 2018-06-29 ENCOUNTER — Encounter (HOSPITAL_COMMUNITY): Payer: Self-pay | Admitting: Gastroenterology

## 2018-06-29 NOTE — Telephone Encounter (Signed)
I called the number [phone] and it is not her number. I called [phone] and many rings and no answer.

## 2018-07-01 ENCOUNTER — Telehealth: Payer: Self-pay | Admitting: Gastroenterology

## 2018-07-01 NOTE — Telephone Encounter (Signed)
Letter mailed to pt about the medication.

## 2018-07-01 NOTE — Telephone Encounter (Signed)
Please call pt. Her colon biopsies are normal.    DRINK WATER TO KEEP YOUR URINE LIGHT YELLOW.  FOLLOW A HIGH FIBER DIET. AVOID ITEMS THAT CAUSE BLOATING & GAS.   Next colonoscopy in 2025.

## 2018-07-04 ENCOUNTER — Telehealth: Payer: Self-pay

## 2018-07-04 NOTE — Telephone Encounter (Signed)
I called [phone] and it is no longer her number. I called [phone] and could not leave a message, VM full. Tara Wolf or Tara Wolf, can the number [phone] be removed?

## 2018-07-04 NOTE — Telephone Encounter (Signed)
ON RECALL  °

## 2018-07-04 NOTE — Telephone Encounter (Signed)
Opened in error

## 2018-07-05 NOTE — Telephone Encounter (Signed)
Tara Wolf found it and corrected it.

## 2018-07-05 NOTE — Telephone Encounter (Signed)
Letter mailed for results.  

## 2018-07-05 NOTE — Telephone Encounter (Signed)
That phone number is not there from what I can see.

## 2018-10-28 IMAGING — MG MM DIGITAL SCREENING BILAT W/ CAD
4 series · 4 of 4 positions shown · non-contrast
Comparison: Previous exam(s).

CLINICAL DATA: Screening.

EXAM:
DIGITAL SCREENING BILATERAL MAMMOGRAM WITH CAD

[L CC]
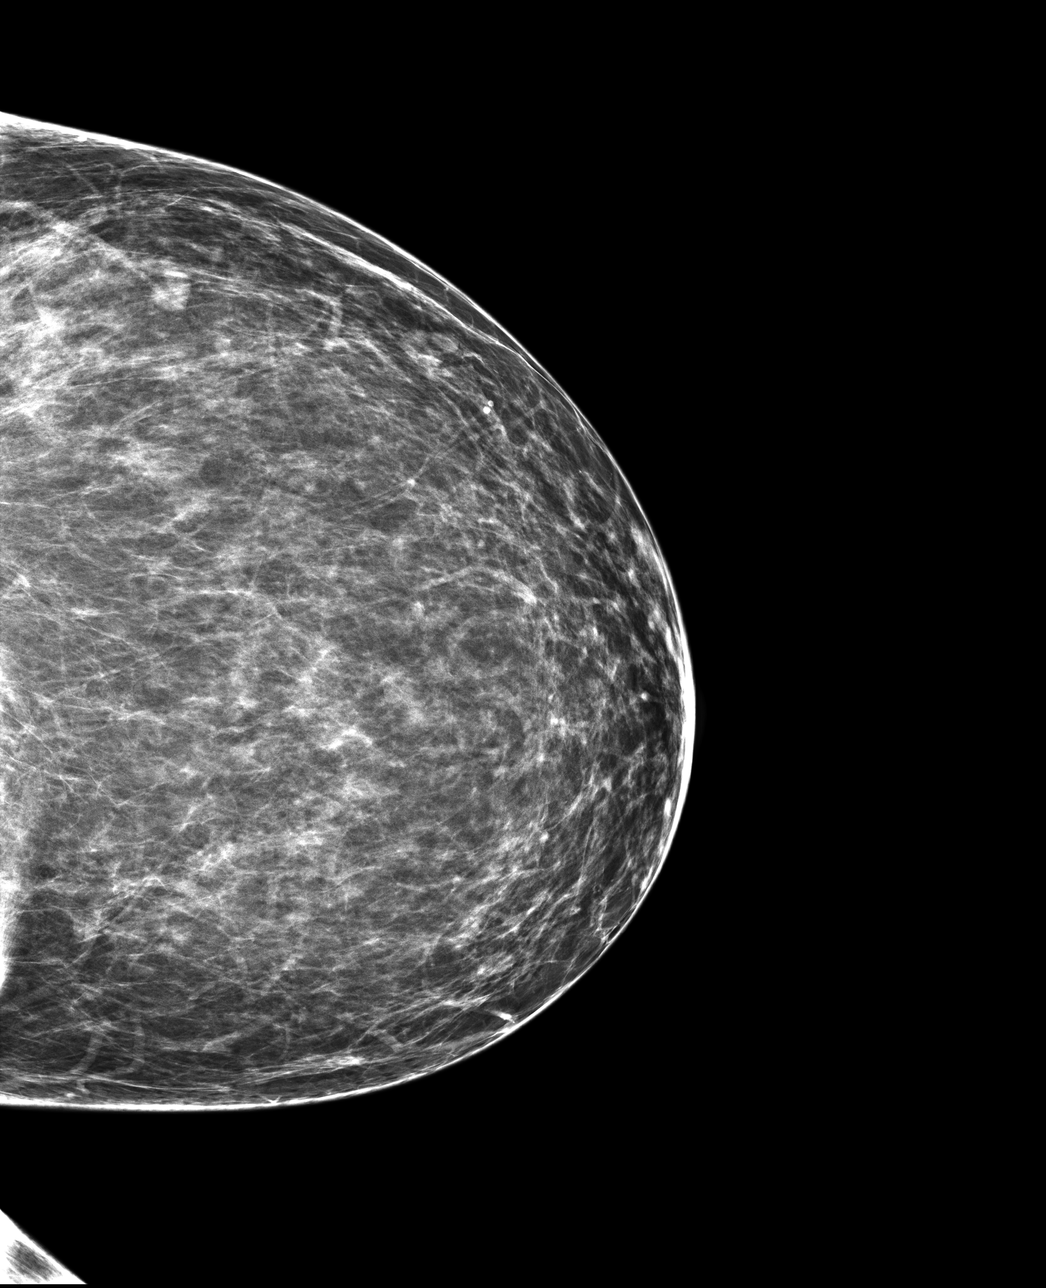

[L MLO]
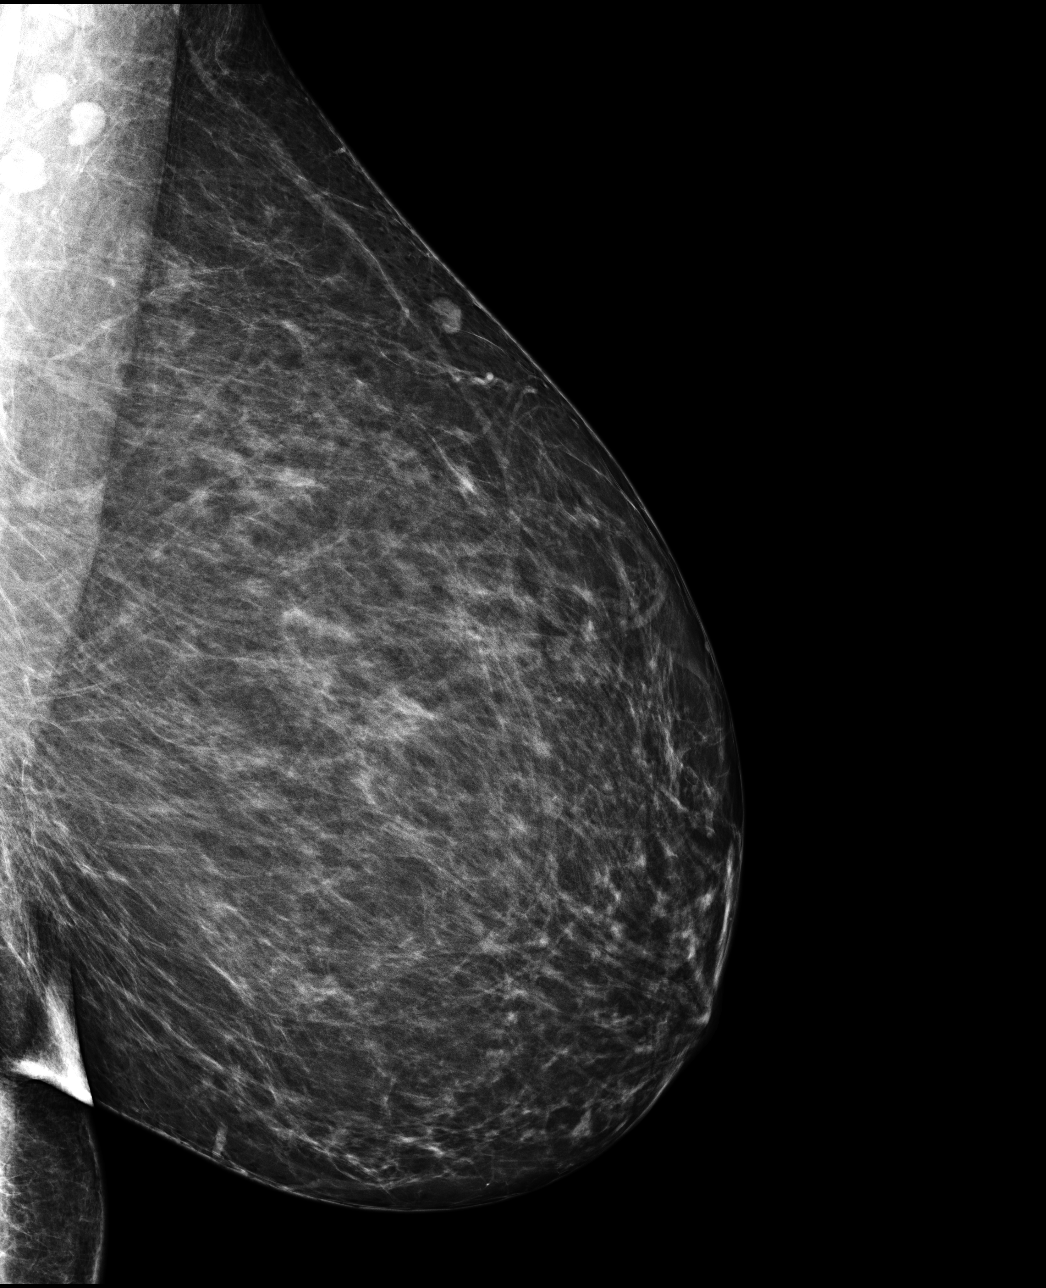

[R MLO]
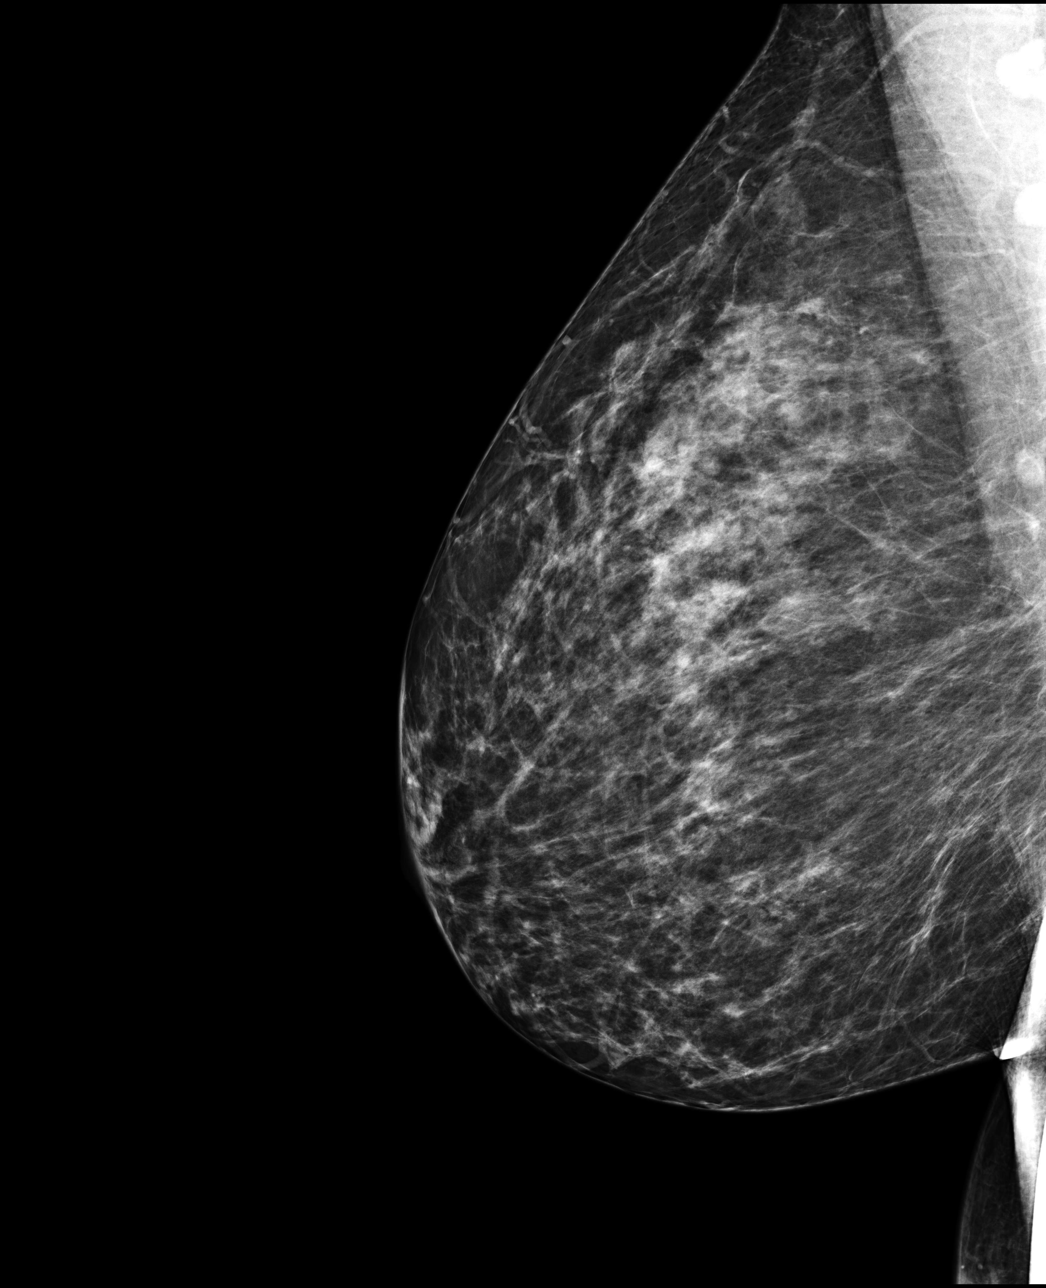

[R CC]
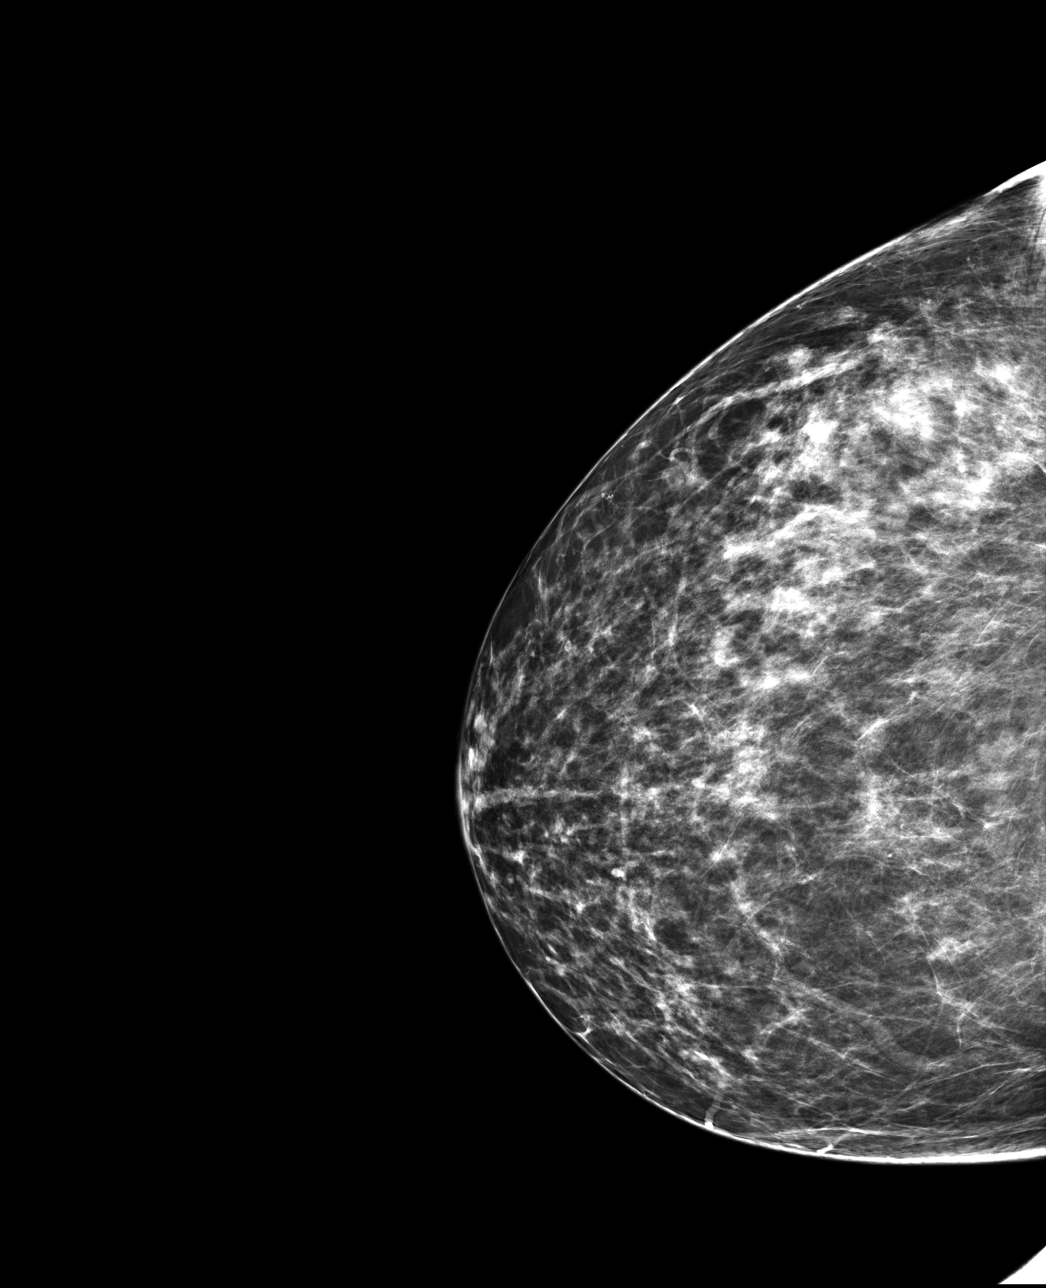

[4 of 4 positions shown; findings below may reference images not displayed]

ACR Breast Density Category b: There are scattered areas of
fibroglandular density.
FINDINGS: There are no findings suspicious for malignancy. Images were
processed with CAD.
IMPRESSION: No mammographic evidence of malignancy. A result letter of this
screening mammogram will be mailed directly to the patient.

RECOMMENDATION:
Screening mammogram in one year. (Code:AS-G-LCT)

BI-RADS CATEGORY  1: Negative.

## 2018-11-03 ENCOUNTER — Other Ambulatory Visit: Payer: Self-pay | Admitting: Family

## 2018-11-03 DIAGNOSIS — Z6828 Body mass index (BMI) 28.0-28.9, adult: Secondary | ICD-10-CM | POA: Diagnosis not present

## 2018-11-03 DIAGNOSIS — K219 Gastro-esophageal reflux disease without esophagitis: Secondary | ICD-10-CM | POA: Diagnosis not present

## 2018-11-03 DIAGNOSIS — E785 Hyperlipidemia, unspecified: Secondary | ICD-10-CM | POA: Diagnosis not present

## 2018-11-03 DIAGNOSIS — Z1211 Encounter for screening for malignant neoplasm of colon: Secondary | ICD-10-CM | POA: Diagnosis not present

## 2018-11-03 DIAGNOSIS — R079 Chest pain, unspecified: Secondary | ICD-10-CM | POA: Diagnosis not present

## 2018-11-03 DIAGNOSIS — Z713 Dietary counseling and surveillance: Secondary | ICD-10-CM | POA: Diagnosis not present

## 2018-11-03 DIAGNOSIS — Z1231 Encounter for screening mammogram for malignant neoplasm of breast: Secondary | ICD-10-CM

## 2018-12-23 ENCOUNTER — Ambulatory Visit
Admission: RE | Admit: 2018-12-23 | Discharge: 2018-12-23 | Disposition: A | Discharge status: Home / Self Care | Payer: BLUE CROSS/BLUE SHIELD | Source: Ambulatory Visit | Attending: Family | Admitting: Family

## 2018-12-23 DIAGNOSIS — Z1231 Encounter for screening mammogram for malignant neoplasm of breast: Secondary | ICD-10-CM | POA: Insufficient documentation

## 2019-06-21 DIAGNOSIS — Z23 Encounter for immunization: Secondary | ICD-10-CM | POA: Diagnosis not present

## 2019-07-06 ENCOUNTER — Other Ambulatory Visit: Payer: Self-pay

## 2019-07-06 ENCOUNTER — Encounter (HOSPITAL_COMMUNITY): Payer: Self-pay | Admitting: *Deleted

## 2019-07-06 ENCOUNTER — Emergency Department (HOSPITAL_COMMUNITY): Payer: BC Managed Care – PPO

## 2019-07-06 ENCOUNTER — Emergency Department (HOSPITAL_COMMUNITY)
Admission: EM | Admit: 2019-07-06 | Discharge: 2019-07-06 | Disposition: A | Discharge status: Home / Self Care | Payer: BC Managed Care – PPO | Attending: Emergency Medicine | Admitting: Emergency Medicine

## 2019-07-06 DIAGNOSIS — Z79899 Other long term (current) drug therapy: Secondary | ICD-10-CM | POA: Insufficient documentation

## 2019-07-06 DIAGNOSIS — R079 Chest pain, unspecified: Secondary | ICD-10-CM | POA: Diagnosis not present

## 2019-07-06 LAB — CBC
HCT: 42.6 % (ref 36.0–46.0)
Hemoglobin: 13.3 g/dL (ref 12.0–15.0)
MCH: 27.7 pg (ref 26.0–34.0)
MCHC: 31.2 g/dL (ref 30.0–36.0)
MCV: 88.6 fL (ref 80.0–100.0)
Platelets: 291 10*3/uL (ref 150–400)
RBC: 4.81 MIL/uL (ref 3.87–5.11)
RDW: 13.9 % (ref 11.5–15.5)
WBC: 5.5 10*3/uL (ref 4.0–10.5)
nRBC: 0 % (ref 0.0–0.2)

## 2019-07-06 LAB — BASIC METABOLIC PANEL
Anion gap: 8 (ref 5–15)
BUN: 16 mg/dL (ref 6–20)
CO2: 27 mmol/L (ref 22–32)
Calcium: 9.3 mg/dL (ref 8.9–10.3)
Chloride: 105 mmol/L (ref 98–111)
Creatinine, Ser: 0.85 mg/dL (ref 0.44–1.00)
GFR calc Af Amer: >60 mL/min (ref >60)
GFR calc non Af Amer: >60 mL/min (ref >60)
Glucose, Bld: 97 mg/dL (ref 70–99)
Potassium: 4 mmol/L (ref 3.5–5.1)
Sodium: 140 mmol/L (ref 135–145)

## 2019-07-06 LAB — TROPONIN I (HIGH SENSITIVITY)
Troponin I (High Sensitivity): 3 ng/L (ref <18)
Troponin I (High Sensitivity): 3 ng/L (ref <18)

## 2019-07-06 LAB — POC URINE PREG, ED: Preg Test, Ur: NEGATIVE

## 2019-07-06 MED ORDER — LIDOCAINE VISCOUS HCL 2 % MT SOLN
15.0000 mL | Freq: Once | OROMUCOSAL | Status: AC
  Administered 2019-07-06: 15 mL via ORAL
  Filled 2019-07-06: qty 15

## 2019-07-06 MED ORDER — ALUM & MAG HYDROXIDE-SIMETH 200-200-20 MG/5ML PO SUSP
30.0000 mL | Freq: Once | ORAL | Status: AC
  Administered 2019-07-06: 30 mL via ORAL
  Filled 2019-07-06: qty 30

## 2019-07-06 MED ORDER — METHOCARBAMOL 500 MG PO TABS: 500.0000 mg | ORAL_TABLET | Freq: Three times a day (TID) | ORAL | 0 refills | Status: DC | PRN

## 2019-07-06 MED ORDER — SODIUM CHLORIDE 0.9% FLUSH: 3.0000 mL | Freq: Once | INTRAVENOUS | Status: DC

## 2019-07-06 NOTE — ED Provider Notes (Signed)
[facility] EMERGENCY DEPARTMENT Provider Note   CSN: [id number] Arrival date & time: 07/06/19  1151     History Chief Complaint  Patient presents with  . Chest Pain    Tara Wolf is a 54 y.o. female.  HPI Patient presents with chest pain.  In the mid chest.  Dull.  Is been chronic.  Has a history of GERD and states it feels like this.  Took one of her antiacids and then took another over-the-counter medicine that sounds like it may be omeprazole.  Has had some nausea.  No vomiting.  No relief of pain.  No fevers or chills.  No cough.  Has waxed and waned with can really not tell me what makes it better or worse.  States he does not get worse with exertion however.  No known cardiac history.  No history hypertension.  Denies family history of cardiac disease.    Past Medical History:  Diagnosis Date  . Arthritis   . Chronic constipation   . GERD (gastroesophageal reflux disease)   . IBS (irritable bowel syndrome)   . Nausea and vomiting    chronic    Patient Active Problem List   Diagnosis Date Noted  . FH: colon cancer in first degree relative <59 years old 04/19/2018  . GERD 04/04/2009  . CONSTIPATION, CHRONIC 04/04/2009  . WEIGHT GAIN 04/04/2009    Past Surgical History:  Procedure Laterality Date  . ABDOMINAL HYSTERECTOMY    . BREAST BIOPSY Left    neg  . COLONOSCOPY N/A 06/23/2018   Procedure: COLONOSCOPY;  Surgeon: Danie Binder, MD;  Location: [facility];  Service: Endoscopy;  Laterality: N/A;  1:00pm  . COLONOSCOPY WITH ESOPHAGOGASTRODUODENOSCOPY (EGD)  03/2009   Dr. Oneida Alar: Tortuous colon.  Otherwise normal colon.  Moderate sized sliding hiatal hernia.  Multiple erosions in the antrum.  Small bowel biopsies negative for celiac disease.  Marland Kitchen POLYPECTOMY  06/23/2018   Procedure: POLYPECTOMY;  Surgeon: Danie Binder, MD;  Location: [facility];  Service: Endoscopy;;  transverse colon polyp     OB History   No obstetric history on file.      Family History  Problem Relation Age of Onset  . Breast cancer Cousin   . Hypertension Mother   . Cancer Father        colon cancer, age 13  . Cancer Sister        patient doesn't know what kind  . Cancer Brother        not sure primary    Social History   Tobacco Use  . Smoking status: Never Smoker  . Smokeless tobacco: Never Used  Substance Use Topics  . Alcohol use: Yes    Comment: ocassional  . Drug use: No    Home Medications Prior to Admission medications   Medication Sig Start Date End Date Taking? Authorizing Provider  bisacodyl (DULCOLAX) 5 MG EC tablet Take 5 mg by mouth daily as needed for moderate constipation.    [provider]  lubiprostone (AMITIZA) 24 MCG capsule Take 1 capsule (24 mcg total) by mouth 2 (two) times daily with a meal. 06/14/18   Mahala Menghini, PA-C  methocarbamol (ROBAXIN) 500 MG tablet Take 1 tablet (500 mg total) by mouth every 8 (eight) hours as needed for muscle spasms. 07/06/19   Davonna Belling, MD  pantoprazole (PROTONIX) 40 MG tablet Take by mouth.    [provider]  Plecanatide (TRULANCE) 3 MG TABS Take 3  mg by mouth daily. 06/28/18   Mahala Menghini, PA-C    Allergies    Patient has no known allergies.  Review of Systems   Review of Systems  Constitutional: Negative for appetite change.  HENT: Negative for congestion.   Cardiovascular: Positive for chest pain.  Gastrointestinal: Positive for nausea.  Genitourinary: Negative for flank pain.  Musculoskeletal: Negative for back pain.  Skin: Negative for rash.  Neurological: Negative for weakness.  Psychiatric/Behavioral: Negative for confusion.    Physical Exam Updated Vital Signs BP (!) 143/71 (BP Location: Right Arm)   Pulse 60   Temp 98.5 F (36.9 C) (Oral)   Resp 14   Ht 5\' 9"  (1.753 m)   Wt 86.2 kg   SpO2 98%   BMI 28.06 kg/m   Physical Exam Vitals and nursing note reviewed.  Cardiovascular:     Rate and Rhythm: Normal rate and regular  rhythm.  Pulmonary:     Breath sounds: No wheezing or rhonchi.  Chest:     Chest wall: Tenderness present.     Comments: Tenderness to anterior chest wall on the midline.  No crepitance or deformity. Abdominal:     Tenderness: There is abdominal tenderness.     Comments: Mild epigastric tenderness without rebound or guarding.  Musculoskeletal:     Cervical back: Neck supple.  Neurological:     Mental Status: She is alert.     ED Results / Procedures / Treatments   Labs (all labs ordered are listed, but only abnormal results are displayed) Labs Reviewed  BASIC METABOLIC PANEL  CBC  POC URINE PREG, ED  TROPONIN I (HIGH SENSITIVITY)  TROPONIN I (HIGH SENSITIVITY)    EKG EKG Interpretation  Date/Time:  Thursday July 06 2019 12:23:48 EDT Ventricular Rate:  62 PR Interval:  162 QRS Duration: 86 QT Interval:  392 QTC Calculation: 397 R Axis:   42 Text Interpretation: Normal sinus rhythm Normal ECG Confirmed by Davonna Belling [phone]) on 07/06/2019 3:42:40 PM   Radiology DG Chest Portable 1 View  Result Date: 07/06/2019 CLINICAL DATA:  Mid chest pain since yesterday with nausea. EXAM: PORTABLE CHEST 1 VIEW COMPARISON:  02/16/2018 FINDINGS: Lungs are adequately inflated and otherwise clear. Cardiomediastinal silhouette, bones and soft tissues are normal. IMPRESSION: No acute cardiopulmonary disease. Electronically Signed   By: Marin Olp M.D.   On: 07/06/2019 15:58    Procedures Procedures (including critical care time)  Medications Ordered in ED Medications  sodium chloride flush (NS) 0.9 % injection 3 mL (has no administration in time range)  alum & mag hydroxide-simeth (MAALOX/MYLANTA) 200-200-20 MG/5ML suspension 30 mL (30 mLs Oral Given 07/06/19 1559)    And  lidocaine (XYLOCAINE) 2 % viscous mouth solution 15 mL (15 mLs Oral Given 07/06/19 1559)    ED Course  I have reviewed the triage vital signs and the nursing notes.  Pertinent labs & imaging results  that were available during my care of the patient were reviewed by me and considered in my medical decision making (see chart for details).    MDM Rules/Calculators/A&P                      Patient presents with chest pain.  Has been going since yesterday.  Tightness.  She does have nausea.  History of GERD but no relief with her medications.  Reproducible pain on her chest with palpation.  EKG reassuring.  Troponin negative.  Heart score of 2.  Will discharge home  with outpatient follow-up.  Will give medicine to treat as musculoskeletal pain. Final Clinical Impression(s) / ED Diagnoses Final diagnoses:  Nonspecific chest pain    Rx / DC Orders ED Discharge Orders         Ordered    methocarbamol (ROBAXIN) 500 MG tablet  Every 8 hours PRN     07/06/19 1636           Davonna Belling, MD 07/06/19 1640

## 2019-07-06 NOTE — ED Triage Notes (Signed)
Pt with mid cp since yesterday, c/o tightness, + nausea, denies emesis.

## 2019-07-06 NOTE — Discharge Instructions (Addendum)
Follow-up with your doctor as needed for the pains.  Tylenol may also help with the pains.

## 2019-07-11 DIAGNOSIS — K219 Gastro-esophageal reflux disease without esophagitis: Secondary | ICD-10-CM | POA: Diagnosis not present

## 2020-03-04 ENCOUNTER — Other Ambulatory Visit: Payer: Self-pay | Admitting: Family

## 2020-03-04 DIAGNOSIS — Z713 Dietary counseling and surveillance: Secondary | ICD-10-CM | POA: Diagnosis not present

## 2020-03-04 DIAGNOSIS — Z23 Encounter for immunization: Secondary | ICD-10-CM | POA: Diagnosis not present

## 2020-03-04 DIAGNOSIS — Z7189 Other specified counseling: Secondary | ICD-10-CM | POA: Diagnosis not present

## 2020-03-04 DIAGNOSIS — Z1231 Encounter for screening mammogram for malignant neoplasm of breast: Secondary | ICD-10-CM

## 2020-03-04 DIAGNOSIS — K219 Gastro-esophageal reflux disease without esophagitis: Secondary | ICD-10-CM | POA: Diagnosis not present

## 2020-03-04 DIAGNOSIS — K5900 Constipation, unspecified: Secondary | ICD-10-CM | POA: Diagnosis not present

## 2020-04-24 ENCOUNTER — Ambulatory Visit
Admission: RE | Admit: 2020-04-24 | Discharge: 2020-04-24 | Disposition: A | Discharge status: Home / Self Care | Payer: BC Managed Care – PPO | Source: Ambulatory Visit | Attending: Family | Admitting: Family

## 2020-04-24 ENCOUNTER — Other Ambulatory Visit: Payer: Self-pay

## 2020-04-24 DIAGNOSIS — Z1231 Encounter for screening mammogram for malignant neoplasm of breast: Secondary | ICD-10-CM | POA: Diagnosis not present

## 2020-05-03 ENCOUNTER — Other Ambulatory Visit: Payer: Self-pay | Admitting: Family

## 2020-05-03 DIAGNOSIS — R928 Other abnormal and inconclusive findings on diagnostic imaging of breast: Secondary | ICD-10-CM

## 2020-05-03 DIAGNOSIS — N6489 Other specified disorders of breast: Secondary | ICD-10-CM

## 2020-05-15 ENCOUNTER — Other Ambulatory Visit: Payer: Self-pay

## 2020-05-15 ENCOUNTER — Ambulatory Visit
Admission: RE | Admit: 2020-05-15 | Discharge: 2020-05-15 | Disposition: A | Discharge status: Home / Self Care | Payer: BC Managed Care – PPO | Source: Ambulatory Visit | Attending: Family | Admitting: Family

## 2020-05-15 DIAGNOSIS — N6489 Other specified disorders of breast: Secondary | ICD-10-CM

## 2020-05-15 DIAGNOSIS — R928 Other abnormal and inconclusive findings on diagnostic imaging of breast: Secondary | ICD-10-CM | POA: Diagnosis not present

## 2020-05-17 ENCOUNTER — Other Ambulatory Visit: Payer: Self-pay | Admitting: Cardiothoracic Surgery

## 2020-05-17 DIAGNOSIS — R928 Other abnormal and inconclusive findings on diagnostic imaging of breast: Secondary | ICD-10-CM

## 2020-05-21 ENCOUNTER — Other Ambulatory Visit: Payer: Self-pay | Admitting: Family

## 2020-05-21 DIAGNOSIS — R928 Other abnormal and inconclusive findings on diagnostic imaging of breast: Secondary | ICD-10-CM

## 2020-06-12 ENCOUNTER — Emergency Department (HOSPITAL_COMMUNITY): Payer: BC Managed Care – PPO

## 2020-06-12 ENCOUNTER — Emergency Department (HOSPITAL_COMMUNITY)
Admission: EM | Admit: 2020-06-12 | Discharge: 2020-06-12 | Disposition: A | Discharge status: Home / Self Care | Payer: BC Managed Care – PPO | Attending: Emergency Medicine | Admitting: Emergency Medicine

## 2020-06-12 ENCOUNTER — Other Ambulatory Visit: Payer: Self-pay

## 2020-06-12 ENCOUNTER — Encounter (HOSPITAL_COMMUNITY): Payer: Self-pay | Admitting: Emergency Medicine

## 2020-06-12 DIAGNOSIS — R079 Chest pain, unspecified: Secondary | ICD-10-CM | POA: Diagnosis not present

## 2020-06-12 DIAGNOSIS — R7989 Other specified abnormal findings of blood chemistry: Secondary | ICD-10-CM | POA: Diagnosis not present

## 2020-06-12 DIAGNOSIS — R0789 Other chest pain: Secondary | ICD-10-CM | POA: Insufficient documentation

## 2020-06-12 DIAGNOSIS — R911 Solitary pulmonary nodule: Secondary | ICD-10-CM | POA: Diagnosis not present

## 2020-06-12 DIAGNOSIS — K449 Diaphragmatic hernia without obstruction or gangrene: Secondary | ICD-10-CM | POA: Diagnosis not present

## 2020-06-12 LAB — CBC
HCT: 39.1 % (ref 36.0–46.0)
Hemoglobin: 12.6 g/dL (ref 12.0–15.0)
MCH: 27.6 pg (ref 26.0–34.0)
MCHC: 32.2 g/dL (ref 30.0–36.0)
MCV: 85.7 fL (ref 80.0–100.0)
Platelets: 216 10*3/uL (ref 150–400)
RBC: 4.56 MIL/uL (ref 3.87–5.11)
RDW: 14.1 % (ref 11.5–15.5)
WBC: 5.4 10*3/uL (ref 4.0–10.5)
nRBC: 0 % (ref 0.0–0.2)

## 2020-06-12 LAB — BASIC METABOLIC PANEL
Anion gap: 9 (ref 5–15)
BUN: 18 mg/dL (ref 6–20)
CO2: 24 mmol/L (ref 22–32)
Calcium: 8.9 mg/dL (ref 8.9–10.3)
Chloride: 103 mmol/L (ref 98–111)
Creatinine, Ser: 0.72 mg/dL (ref 0.44–1.00)
GFR, Estimated: >60 mL/min (ref >60)
Glucose, Bld: 112 mg/dL — ABNORMAL HIGH (ref 70–99)
Potassium: 3.7 mmol/L (ref 3.5–5.1)
Sodium: 136 mmol/L (ref 135–145)

## 2020-06-12 LAB — D-DIMER, QUANTITATIVE: D-Dimer, Quant: 0.68 ug/mL-FEU — ABNORMAL HIGH (ref 0.00–0.50)

## 2020-06-12 LAB — TROPONIN I (HIGH SENSITIVITY)
Troponin I (High Sensitivity): 4 ng/L (ref <18)
Troponin I (High Sensitivity): 4 ng/L (ref <18)

## 2020-06-12 MED ORDER — DICLOFENAC SODIUM 75 MG PO TBEC: 75.0000 mg | DELAYED_RELEASE_TABLET | Freq: Two times a day (BID) | ORAL | 0 refills | Status: DC

## 2020-06-12 MED ORDER — KETOROLAC TROMETHAMINE 30 MG/ML IJ SOLN
15.0000 mg | Freq: Once | INTRAMUSCULAR | Status: AC
  Administered 2020-06-12: 15 mg via INTRAVENOUS
  Filled 2020-06-12: qty 1

## 2020-06-12 MED ORDER — IOHEXOL 350 MG/ML SOLN
100.0000 mL | Freq: Once | INTRAVENOUS | Status: AC | PRN
  Administered 2020-06-12: 100 mL via INTRAVENOUS

## 2020-06-12 MED ORDER — ASPIRIN 81 MG PO CHEW
324.0000 mg | CHEWABLE_TABLET | Freq: Once | ORAL | Status: AC
  Administered 2020-06-12: 324 mg via ORAL
  Filled 2020-06-12: qty 4

## 2020-06-12 NOTE — ED Provider Notes (Signed)
[facility] EMERGENCY DEPARTMENT Provider Note   CSN: [id number] Arrival date & time: 06/12/20  8850     History Chief Complaint  Patient presents with  . Chest Pain    Tara Wolf is a 55 y.o. female with a history significant only for GERD and IBS presenting with a 3-day history of sharp stabbing midsternal chest pain. She initially felt the symptom while at rest 3 days ago. She states it was intermittent initially but has become constant and now triggered by certain movements, position and deep inspiration however denies shortness of breath, has had no dizziness, perhaps some mild nausea with 1 or 2 episodes, no vomiting, no diaphoresis. She denies peripheral edema or leg pain. She takes Prevacid daily which is not improving the symptom and she states this pain does not feel like her GERD symptoms.  Patient has no family history of cardiac disease. She does endorse having a "mild heart attack" years ago and was treated here for that event. Review of chart indicates no ED visit were hospital admission for MI.  Per review of care everywhere, she was evaluated by cardiology in [place], [place] in 2019 after a syncopal event.  Holter monitor and echo completed and without concerns.  HPI  HPI: A 55 year old patient presents for evaluation of chest pain. Initial onset of pain was more than 6 hours ago. The patient's chest pain is sharp and is not worse with exertion. The patient complains of nausea. The patient's chest pain is middle- or left-sided, is not well-localized, is not described as heaviness/pressure/tightness and does not radiate to the arms/jaw/neck. The patient denies diaphoresis. The patient has no history of stroke, has no history of peripheral artery disease, has not smoked in the past 90 days, denies any history of treated diabetes, has no relevant family history of coronary artery disease (first degree relative at less than age 68), is not hypertensive, has no history of  hypercholesterolemia and does not have an elevated BMI (>=30).   Past Medical History:  Diagnosis Date  . Arthritis   . Chronic constipation   . GERD (gastroesophageal reflux disease)   . IBS (irritable bowel syndrome)   . Nausea and vomiting    chronic    Patient Active Problem List   Diagnosis Date Noted  . FH: colon cancer in first degree relative <23 years old 04/19/2018  . GERD 04/04/2009  . CONSTIPATION, CHRONIC 04/04/2009  . WEIGHT GAIN 04/04/2009    Past Surgical History:  Procedure Laterality Date  . ABDOMINAL HYSTERECTOMY    . BREAST BIOPSY Left    neg  . COLONOSCOPY N/A 06/23/2018   Procedure: COLONOSCOPY;  Surgeon: Danie Binder, MD;  Location: [facility];  Service: Endoscopy;  Laterality: N/A;  1:00pm  . COLONOSCOPY WITH ESOPHAGOGASTRODUODENOSCOPY (EGD)  03/2009   Dr. Oneida Alar: Tortuous colon.  Otherwise normal colon.  Moderate sized sliding hiatal hernia.  Multiple erosions in the antrum.  Small bowel biopsies negative for celiac disease.  Marland Kitchen POLYPECTOMY  06/23/2018   Procedure: POLYPECTOMY;  Surgeon: Danie Binder, MD;  Location: [facility];  Service: Endoscopy;;  transverse colon polyp     OB History   No obstetric history on file.     Family History  Problem Relation Age of Onset  . Breast cancer Cousin   . Hypertension Mother   . Cancer Father        colon cancer, age 77  . Cancer Sister  patient doesn't know what kind  . Cancer Brother        not sure primary    Social History   Tobacco Use  . Smoking status: Never Smoker  . Smokeless tobacco: Never Used  Vaping Use  . Vaping Use: Never used  Substance Use Topics  . Alcohol use: Yes    Comment: ocassional  . Drug use: No    Home Medications Prior to Admission medications   Medication Sig Start Date End Date Taking? Authorizing Provider  acetaminophen (TYLENOL) 325 MG tablet Take 650 mg by mouth every 6 (six) hours as needed for mild pain, fever or headache.   Yes  [provider]  Omeprazole (PRILOSEC PO) Take 1 tablet by mouth daily as needed (reflux).   Yes [provider]  lubiprostone (AMITIZA) 24 MCG capsule Take 1 capsule (24 mcg total) by mouth 2 (two) times daily with a meal. Patient not taking: No sig reported 06/14/18   Mahala Menghini, PA-C  methocarbamol (ROBAXIN) 500 MG tablet Take 1 tablet (500 mg total) by mouth every 8 (eight) hours as needed for muscle spasms. Patient not taking: No sig reported 07/06/19   Davonna Belling, MD  Plecanatide (TRULANCE) 3 MG TABS Take 3 mg by mouth daily. Patient not taking: No sig reported 06/28/18   Mahala Menghini, PA-C    Allergies    Patient has no known allergies.  Review of Systems   Review of Systems  Constitutional: Negative for chills and fever.  HENT: Negative for congestion and sore throat.   Eyes: Negative.   Respiratory: Positive for chest tightness. Negative for cough and shortness of breath.   Cardiovascular: Negative for chest pain.  Gastrointestinal: Positive for nausea. Negative for abdominal pain and vomiting.  Genitourinary: Negative.   Musculoskeletal: Negative for arthralgias, joint swelling and neck pain.  Skin: Negative.  Negative for rash and wound.  Neurological: Negative for dizziness, weakness, light-headedness, numbness and headaches.  Psychiatric/Behavioral: Negative.   All other systems reviewed and are negative.   Physical Exam Updated Vital Signs BP 116/64   Pulse 70   Temp 98.3 F (36.8 C) (Oral)   Resp 17   Ht 5\' 9"  (1.753 m)   Wt 88.9 kg   SpO2 100%   BMI 28.94 kg/m   Physical Exam Vitals and nursing note reviewed.  Constitutional:      General: She is not in acute distress.    Appearance: She is well-developed and well-nourished.  HENT:     Head: Normocephalic and atraumatic.  Eyes:     Conjunctiva/sclera: Conjunctivae normal.  Cardiovascular:     Rate and Rhythm: Normal rate and regular rhythm.     Pulses: Intact distal  pulses.     Heart sounds: Normal heart sounds.  Pulmonary:     Effort: Pulmonary effort is normal.     Breath sounds: Normal breath sounds. No wheezing.  Abdominal:     General: Bowel sounds are normal.     Palpations: Abdomen is soft.     Tenderness: There is no abdominal tenderness.  Musculoskeletal:        General: Normal range of motion.     Cervical back: Normal range of motion.     Right lower leg: No tenderness. No edema.     Left lower leg: No tenderness. No edema.  Skin:    General: Skin is warm and dry.  Neurological:     Mental Status: She is alert.  Psychiatric:  Mood and Affect: Mood and affect normal.     ED Results / Procedures / Treatments   Labs (all labs ordered are listed, but only abnormal results are displayed) Labs Reviewed  BASIC METABOLIC PANEL - Abnormal; Notable for the following components:      Result Value   Glucose, Bld 112 (*)    All other components within normal limits  D-DIMER, QUANTITATIVE - Abnormal; Notable for the following components:   D-Dimer, Quant 0.68 (*)    All other components within normal limits  CBC  TROPONIN I (HIGH SENSITIVITY)  TROPONIN I (HIGH SENSITIVITY)    EKG EKG Interpretation  Date/Time:  Wednesday June 12 2020 09:47:07 EST Ventricular Rate:  69 PR Interval:    QRS Duration: 102 QT Interval:  415 QTC Calculation: 445 R Axis:   33 Text Interpretation: Sinus rhythm no acute st/ts No significant change since prior 3/21 Confirmed by Aletta Edouard [phone]) on 06/12/2020 9:56:11 AM   Radiology CT Angio Chest PE W and/or Wo Contrast  Result Date: 06/12/2020 CLINICAL DATA:  Positive D-dimer, chest pain. EXAM: CT ANGIOGRAPHY CHEST WITH CONTRAST TECHNIQUE: Multidetector CT imaging of the chest was performed using the standard protocol during bolus administration of intravenous contrast. Multiplanar CT image reconstructions and MIPs were obtained to evaluate the vascular anatomy. CONTRAST:  128mL OMNIPAQUE  IOHEXOL 350 MG/ML SOLN COMPARISON:  None. FINDINGS: Cardiovascular: Negative for pulmonary embolus. Heart is enlarged. No pericardial effusion. Mediastinum/Nodes: No pathologically enlarged mediastinal, hilar or axillary lymph nodes. Esophagus is grossly unremarkable. Lungs/Pleura: Bilateral pulmonary nodules measure up to 5 mm in the right lower lobe (6/70). Lungs are otherwise clear. No pleural fluid. Airway is unremarkable. Upper Abdomen: Visualized portions of the liver, gallbladder, adrenal glands, kidneys, spleen, pancreas, stomach and bowel are unremarkable with the exception of a small hiatal hernia. Musculoskeletal: Mild degenerative changes in the spine. Review of the MIP images confirms the above findings. IMPRESSION: 1. Negative for pulmonary embolus. 2. Pulmonary nodules measure 5 mm or less in size. No follow-up needed if patient is low-risk (and has no known or suspected primary neoplasm). Non-contrast chest CT can be considered in 12 months if patient is high-risk. This recommendation follows the consensus statement: Guidelines for Management of Incidental Pulmonary Nodules Detected on CT Images: From the Fleischner Society 2017; Radiology 2017; 284:228-243. Electronically Signed   By: Lorin Picket M.D.   On: 06/12/2020 13:24   DG Chest Portable 1 View  Result Date: 06/12/2020 CLINICAL DATA:  Chest pain. EXAM: PORTABLE CHEST 1 VIEW COMPARISON:  July 06, 2019. FINDINGS: The heart size and mediastinal contours are within normal limits. Both lungs are clear. No pneumothorax or pleural effusion is noted. The visualized skeletal structures are unremarkable. IMPRESSION: No active disease. Electronically Signed   By: Marijo Conception M.D.   On: 06/12/2020 09:52    Procedures Procedures   Medications Ordered in ED Medications  aspirin chewable tablet 324 mg (324 mg Oral Given 06/12/20 0938)  ketorolac (TORADOL) 30 MG/ML injection 15 mg (15 mg Intravenous Given 06/12/20 1153)  iohexol (OMNIPAQUE)  350 MG/ML injection 100 mL (100 mLs Intravenous Contrast Given 06/12/20 1244)    ED Course  I have reviewed the triage vital signs and the nursing notes.  Pertinent labs & imaging results that were available during my care of the patient were reviewed by me and considered in my medical decision making (see chart for details).  Clinical Course as of 06/12/20 1343  Wed Jun 12, 2020  1052  Pt's labs significant for elevated d dimer. Denies sob, but endorses pleuritic pain.  CT angio ordered to rule out PE.  Delta troponins pending, initial troponin normal. [JI]    Clinical Course User Index [JI] Evalee Jefferson, PA-C   MDM Rules/Calculators/A&P HEAR Score: 1                       Pt with atypical chest pain favoring chest wall pain as pain is triggered with positional changes but also with pleuritic component.  D dimer elevated so proceeded to CT angio which was negative for acute findings.  Delta troponins negative.    Suspect msk source of sx, no PE, ACS unlikely.  Advised nsaids, heat, f/u pcp for any persistent or worsened sx.   Final Clinical Impression(s) / ED Diagnoses Final diagnoses:  Atypical chest pain  Chest wall pain    Rx / DC Orders ED Discharge Orders    None       Landis Martins 06/12/20 1343    Hayden Rasmussen, MD 06/12/20 Einar Crow

## 2020-06-12 NOTE — ED Triage Notes (Signed)
Pt c/o cp that started 3 days ago. Pt states it got worse last night when she was working, she bent over and wasn't able to get up.

## 2020-06-12 NOTE — Discharge Instructions (Addendum)
Your lab tests, ekg and CT scan are normal today with no evidence of heart or lung involvement to explain your symptoms.  I suspect you have strained your chest wall (muscles, ribs) since your symptoms are worsened with movement.  You may take the anti inflammatory pain reliever prescribed.  Gentle heat (heating pad) can also help relieve your symptoms.  I encourage following up with your primary MD if your symptoms are not resolving with this treatment.

## 2020-07-10 ENCOUNTER — Encounter: Payer: Self-pay | Admitting: Internal Medicine

## 2020-07-10 ENCOUNTER — Other Ambulatory Visit: Payer: Self-pay

## 2020-07-10 ENCOUNTER — Ambulatory Visit: Payer: BC Managed Care – PPO | Admitting: Internal Medicine

## 2020-07-10 VITALS — BP 149/86 | HR 70 | Resp 18 | Ht 69.0 in | Wt 203.0 lb

## 2020-07-10 DIAGNOSIS — H6122 Impacted cerumen, left ear: Secondary | ICD-10-CM | POA: Diagnosis not present

## 2020-07-10 DIAGNOSIS — Z7689 Persons encountering health services in other specified circumstances: Secondary | ICD-10-CM | POA: Diagnosis not present

## 2020-07-10 DIAGNOSIS — H6123 Impacted cerumen, bilateral: Secondary | ICD-10-CM | POA: Insufficient documentation

## 2020-07-10 DIAGNOSIS — I10 Essential (primary) hypertension: Secondary | ICD-10-CM | POA: Insufficient documentation

## 2020-07-10 DIAGNOSIS — K5900 Constipation, unspecified: Secondary | ICD-10-CM

## 2020-07-10 DIAGNOSIS — H60502 Unspecified acute noninfective otitis externa, left ear: Secondary | ICD-10-CM

## 2020-07-10 DIAGNOSIS — Z23 Encounter for immunization: Secondary | ICD-10-CM

## 2020-07-10 DIAGNOSIS — E559 Vitamin D deficiency, unspecified: Secondary | ICD-10-CM | POA: Insufficient documentation

## 2020-07-10 DIAGNOSIS — G5601 Carpal tunnel syndrome, right upper limb: Secondary | ICD-10-CM

## 2020-07-10 DIAGNOSIS — K219 Gastro-esophageal reflux disease without esophagitis: Secondary | ICD-10-CM

## 2020-07-10 DIAGNOSIS — E785 Hyperlipidemia, unspecified: Secondary | ICD-10-CM | POA: Insufficient documentation

## 2020-07-10 DIAGNOSIS — Z90710 Acquired absence of both cervix and uterus: Secondary | ICD-10-CM | POA: Insufficient documentation

## 2020-07-10 HISTORY — DX: Acquired absence of both cervix and uterus: Z90.710

## 2020-07-10 MED ORDER — CIPROFLOXACIN-DEXAMETHASONE 0.3-0.1 % OT SUSP: 4.0000 [drp] | Freq: Two times a day (BID) | OTIC | 0 refills | Status: DC

## 2020-07-10 MED ORDER — SENNOSIDES-DOCUSATE SODIUM 8.6-50 MG PO TABS: 1.0000 | ORAL_TABLET | Freq: Every day | ORAL | 0 refills | Status: DC | PRN

## 2020-07-10 MED ORDER — OMEPRAZOLE 20 MG PO CPDR
20.0000 mg | DELAYED_RELEASE_CAPSULE | Freq: Every day | ORAL | 5 refills | Status: DC
Start: 2020-07-10 — End: 2020-12-15

## 2020-07-10 MED ORDER — AMLODIPINE BESYLATE 5 MG PO TABS: 5.0000 mg | ORAL_TABLET | Freq: Every day | ORAL | 0 refills | Status: DC

## 2020-07-10 NOTE — Assessment & Plan Note (Signed)
BP Readings from Last 1 Encounters:  07/10/20 (!) 149/86   New-onset Started Amlodipine 5 mg QD Counseled for compliance with the medications Advised DASH diet and moderate exercise/walking, at least 150 mins/week

## 2020-07-10 NOTE — Assessment & Plan Note (Signed)
Left ear irrigation done

## 2020-07-10 NOTE — Assessment & Plan Note (Signed)
Reports h/o IBS-C Started Senokot for now

## 2020-07-10 NOTE — Patient Instructions (Addendum)
Please start taking Amlodipine as prescribed.  Please follow DASH diet and perform moderate exercise/walking at least 150 mins/week.   [email or web address]">  DASH Eating Plan DASH stands for Dietary Approaches to Stop Hypertension. The DASH eating plan is a healthy eating plan that has been shown to:  Reduce high blood pressure (hypertension).  Reduce your risk for type 2 diabetes, heart disease, and stroke.  Help with weight loss. What are tips for following this plan? Reading food labels  Check food labels for the amount of salt (sodium) per serving. Choose foods with less than 5 percent of the Daily Value of sodium. Generally, foods with less than 300 milligrams (mg) of sodium per serving fit into this eating plan.  To find whole grains, look for the word "whole" as the first word in the ingredient list. Shopping  Buy products labeled as "low-sodium" or "no salt added."  Buy fresh foods. Avoid canned foods and pre-made or frozen meals. Cooking  Avoid adding salt when cooking. Use salt-free seasonings or herbs instead of table salt or sea salt. Check with your health care provider or pharmacist before using salt substitutes.  Do not fry foods. Cook foods using healthy methods such as baking, boiling, grilling, roasting, and broiling instead.  Cook with heart-healthy oils, such as olive, canola, avocado, soybean, or sunflower oil. Meal planning  Eat a balanced diet that includes: ? 4 or more servings of fruits and 4 or more servings of vegetables each day. Try to fill one-half of your plate with fruits and vegetables. ? 6-8 servings of whole grains each day. ? Less than 6 oz (170 g) of lean meat, poultry, or fish each day. A 3-oz (85-g) serving of meat is about the same size as a deck of cards. One egg equals 1 oz (28 g). ? 2-3 servings of low-fat dairy each day. One serving is 1 cup (237 mL). ? 1 serving of nuts, seeds, or beans 5  times each week. ? 2-3 servings of heart-healthy fats. Healthy fats called omega-3 fatty acids are found in foods such as walnuts, flaxseeds, fortified milks, and eggs. These fats are also found in cold-water fish, such as sardines, salmon, and mackerel.  Limit how much you eat of: ? Canned or prepackaged foods. ? Food that is high in trans fat, such as some fried foods. ? Food that is high in saturated fat, such as fatty meat. ? Desserts and other sweets, sugary drinks, and other foods with added sugar. ? Full-fat dairy products.  Do not salt foods before eating.  Do not eat more than 4 egg yolks a week.  Try to eat at least 2 vegetarian meals a week.  Eat more home-cooked food and less restaurant, buffet, and fast food.   Lifestyle  When eating at a restaurant, ask that your food be prepared with less salt or no salt, if possible.  If you drink alcohol: ? Limit how much you use to:  0-1 drink a day for women who are not pregnant.  0-2 drinks a day for men. ? Be aware of how much alcohol is in your drink. In the [place] one drink equals one 12 oz bottle of beer (355 mL), one 5 oz glass of wine (148 mL), or one 1 oz glass of hard liquor (44 mL). General information  Avoid eating more than 2,300 mg of salt a day. If you have hypertension, you may need to reduce your sodium intake to 1,500 mg a day.  Work with  your health care provider to maintain a healthy body weight or to lose weight. Ask what an ideal weight is for you.  Get at least 30 minutes of exercise that causes your heart to beat faster (aerobic exercise) most days of the week. Activities may include walking, swimming, or biking.  Work with your health care provider or dietitian to adjust your eating plan to your individual calorie needs. What foods should I eat? Fruits All fresh, dried, or frozen fruit. Canned fruit in natural juice (without added sugar). Vegetables Fresh or frozen vegetables (raw, steamed, roasted,  or grilled). Low-sodium or reduced-sodium tomato and vegetable juice. Low-sodium or reduced-sodium tomato sauce and tomato paste. Low-sodium or reduced-sodium canned vegetables. Grains Whole-grain or whole-wheat bread. Whole-grain or whole-wheat pasta. Brown rice. [place]. Bulgur. Whole-grain and low-sodium cereals. Pita bread. Low-fat, low-sodium crackers. Whole-wheat flour tortillas. Meats and other proteins Skinless chicken or [place]. Ground chicken or [place]. Pork with fat trimmed off. Fish and seafood. Egg whites. Dried beans, peas, or lentils. Unsalted nuts, nut butters, and seeds. Unsalted canned beans. Lean cuts of beef with fat trimmed off. Low-sodium, lean precooked or cured meat, such as sausages or meat loaves. Dairy Low-fat (1%) or fat-free (skim) milk. Reduced-fat, low-fat, or fat-free cheeses. Nonfat, low-sodium ricotta or cottage cheese. Low-fat or nonfat yogurt. Low-fat, low-sodium cheese. Fats and oils Soft margarine without trans fats. Vegetable oil. Reduced-fat, low-fat, or light mayonnaise and salad dressings (reduced-sodium). Canola, safflower, olive, avocado, soybean, and sunflower oils. Avocado. Seasonings and condiments Herbs. Spices. Seasoning mixes without salt. Other foods Unsalted popcorn and pretzels. Fat-free sweets. The items listed above may not be a complete list of foods and beverages you can eat. Contact a dietitian for more information. What foods should I avoid? Fruits Canned fruit in a light or heavy syrup. Fried fruit. Fruit in cream or butter sauce. Vegetables Creamed or fried vegetables. Vegetables in a cheese sauce. Regular canned vegetables (not low-sodium or reduced-sodium). Regular canned tomato sauce and paste (not low-sodium or reduced-sodium). Regular tomato and vegetable juice (not low-sodium or reduced-sodium). Angie Fava. Olives. Grains Baked goods made with fat, such as croissants, muffins, or some breads. Dry pasta or rice meal  packs. Meats and other proteins Fatty cuts of meat. Ribs. Fried meat. Berniece Salines. Bologna, salami, and other precooked or cured meats, such as sausages or meat loaves. Fat from the back of a pig (fatback). Bratwurst. Salted nuts and seeds. Canned beans with added salt. Canned or smoked fish. Whole eggs or egg yolks. Chicken or [place] with skin. Dairy Whole or 2% milk, cream, and half-and-half. Whole or full-fat cream cheese. Whole-fat or sweetened yogurt. Full-fat cheese. Nondairy creamers. Whipped toppings. Processed cheese and cheese spreads. Fats and oils Butter. Stick margarine. Lard. Shortening. Ghee. Bacon fat. Tropical oils, such as coconut, palm kernel, or palm oil. Seasonings and condiments Onion salt, garlic salt, seasoned salt, table salt, and sea salt. Worcestershire sauce. Tartar sauce. Barbecue sauce. Teriyaki sauce. Soy sauce, including reduced-sodium. Steak sauce. Canned and packaged gravies. Fish sauce. Oyster sauce. Cocktail sauce. Store-bought horseradish. Ketchup. Mustard. Meat flavorings and tenderizers. Bouillon cubes. Hot sauces. Pre-made or packaged marinades. Pre-made or packaged taco seasonings. Relishes. Regular salad dressings. Other foods Salted popcorn and pretzels. The items listed above may not be a complete list of foods and beverages you should avoid. Contact a dietitian for more information. Where to find more information  National Heart, Lung, and Blood Institute: https://wilson-eaton.com/  American Heart Association: www.heart.org  Academy of Nutrition and Dietetics: www.eatright.org  [place]: www.kidney.org Summary  The DASH eating plan is a healthy eating plan that has been shown to reduce high blood pressure (hypertension). It may also reduce your risk for type 2 diabetes, heart disease, and stroke.  When on the DASH eating plan, aim to eat more fresh fruits and vegetables, whole grains, lean proteins, low-fat dairy, and heart-healthy  fats.  With the DASH eating plan, you should limit salt (sodium) intake to 2,300 mg a day. If you have hypertension, you may need to reduce your sodium intake to 1,500 mg a day.  Work with your health care provider or dietitian to adjust your eating plan to your individual calorie needs. This information is not intended to replace advice given to you by your health care provider. Make sure you discuss any questions you have with your health care provider. Document Revised: 03/03/2019 Document Reviewed: 03/03/2019 Elsevier Patient Education  2021 [employer].

## 2020-07-10 NOTE — Progress Notes (Signed)
New Patient Office Visit  Subjective:  Patient ID: Tara Wolf, female    DOB: 1965/08/02  Age: 55 y.o. MRN: [id number]  CC:  Chief Complaint  Patient presents with  . New Patient (Initial Visit)    New patient has had earache for 4 days has been using ear drops     HPI Tara Wolf is a 55 year old female with PMH of GERD, IBS-C amd HLD who presents for establishing care.  She c/o left ear pain for last 4 days, but denies any ear discharge. She tried applying peroxide ear drops with no relief. She denies any fever, chills, tinnitus, but does report muffling of sounds intermittently.  Her BP was elevated in the office today. She states that her BP has been reported to be high in the past. She is not on any medications for HTN currently. She denies any dizziness, chest pain, dyspnea or palpitations.  She has h/o carpal tunnel syndrome and wears a specific glove for it.  She has h/o chronic constipation, and has tried Colace, OTC fibers and Metamucil with minimal relief. She had been placed on Amitiza in the past by GI.  Past Medical History:  Diagnosis Date  . Arthritis   . Chronic constipation   . GERD (gastroesophageal reflux disease)   . H/O: hysterectomy 07/10/2020  . IBS (irritable bowel syndrome)   . Nausea and vomiting    chronic  . Syncope 05/19/2017    Past Surgical History:  Procedure Laterality Date  . ABDOMINAL HYSTERECTOMY    . BREAST BIOPSY Left    neg  . COLONOSCOPY N/A 06/23/2018   Procedure: COLONOSCOPY;  Surgeon: Danie Binder, MD;  Location: [facility];  Service: Endoscopy;  Laterality: N/A;  1:00pm  . COLONOSCOPY WITH ESOPHAGOGASTRODUODENOSCOPY (EGD)  03/2009   Dr. Oneida Alar: Tortuous colon.  Otherwise normal colon.  Moderate sized sliding hiatal hernia.  Multiple erosions in the antrum.  Small bowel biopsies negative for celiac disease.  Marland Kitchen POLYPECTOMY  06/23/2018   Procedure: POLYPECTOMY;  Surgeon: Danie Binder, MD;  Location: [facility];  Service: Endoscopy;;  transverse colon polyp    Family History  Problem Relation Age of Onset  . Breast cancer Cousin   . Hypertension Mother   . Cancer Father        colon cancer, age 59  . Cancer Sister        patient doesn't know what kind  . Cancer Brother        not sure primary    Social History   Socioeconomic History  . Marital status: Single    Spouse name: Not on file  . Number of children: Not on file  . Years of education: Not on file  . Highest education level: Not on file  Occupational History  . Not on file  Tobacco Use  . Smoking status: Never Smoker  . Smokeless tobacco: Never Used  Vaping Use  . Vaping Use: Never used  Substance and Sexual Activity  . Alcohol use: Yes    Comment: ocassional  . Drug use: No  . Sexual activity: Not on file  Other Topics Concern  . Not on file  Social History Narrative  . Not on file   Social Determinants of Health   Financial Resource Strain: Not on file  Food Insecurity: Not on file  Transportation Needs: Not on file  Physical Activity: Not on file  Stress: Not on file  Social Connections: Not on file  Intimate Partner Violence: Not on file    ROS Review of Systems  Constitutional: Negative for chills and fever.  HENT: Positive for ear pain. Negative for congestion, ear discharge, sinus pressure, sinus pain and sore throat.   Eyes: Negative for pain and discharge.  Respiratory: Negative for cough and shortness of breath.   Cardiovascular: Negative for chest pain and palpitations.  Gastrointestinal: Positive for constipation. Negative for abdominal pain, diarrhea, nausea and vomiting.  Endocrine: Negative for polydipsia and polyuria.  Genitourinary: Negative for dysuria and hematuria.  Musculoskeletal: Negative for neck pain and neck stiffness.  Skin: Negative for rash.  Neurological: Negative for dizziness and weakness.  Psychiatric/Behavioral: Negative for agitation and behavioral  problems.    Objective:   Today's Vitals: BP (!) 149/86 (BP Location: Left Arm, Patient Position: Sitting)   Pulse 70   Resp 18   Ht 5\' 9"  (1.753 m)   Wt 203 lb (92.1 kg)   SpO2 99%   BMI 29.98 kg/m   Physical Exam Vitals reviewed.  Constitutional:      General: She is not in acute distress.    Appearance: She is not diaphoretic.  HENT:     Head: Normocephalic and atraumatic.     Right Ear: Ear canal and external ear normal.     Left Ear: There is impacted cerumen.     Ears:     Comments: Erythematous ear canal    Nose: Nose normal.     Mouth/Throat:     Mouth: Mucous membranes are moist.  Eyes:     General: No scleral icterus.    Extraocular Movements: Extraocular movements intact.  Cardiovascular:     Rate and Rhythm: Normal rate and regular rhythm.     Pulses: Normal pulses.     Heart sounds: Normal heart sounds. No murmur heard.   Pulmonary:     Breath sounds: Normal breath sounds. No wheezing or rales.  Abdominal:     Palpations: Abdomen is soft.     Tenderness: There is no abdominal tenderness.  Musculoskeletal:     Cervical back: Neck supple. No tenderness.     Right lower leg: No edema.     Left lower leg: No edema.  Skin:    General: Skin is warm.     Findings: No rash.  Neurological:     General: No focal deficit present.     Mental Status: She is alert and oriented to person, place, and time.  Psychiatric:        Mood and Affect: Mood normal.        Behavior: Behavior normal.     Assessment & Plan:   Problem List Items Addressed This Visit      Encounter to establish care - Primary   Care established Previous chart reviewed History and medications reviewed with the patient       Cardiovascular and Mediastinum   HTN (hypertension)    BP Readings from Last 1 Encounters:  07/10/20 (!) 149/86   New-onset Started Amlodipine 5 mg QD Counseled for compliance with the medications Advised DASH diet and moderate exercise/walking, at least  150 mins/week       Relevant Medications   amLODipine (NORVASC) 5 MG tablet   ciprofloxacin-dexamethasone (CIPRODEX) OTIC suspension     Digestive   Gastroesophageal reflux disease    On Omeprazole 20 mg QD      Relevant Medications   omeprazole (PRILOSEC) 20 MG capsule   senna-docusate (SENOKOT S) 8.6-50 MG tablet  Nervous and Auditory   Impacted cerumen of left ear Acute otitis externa    Left ear irrigation done Erythematous ear canal noted after irrigation Ciprodex ear drops prescribed ENT referral if persistent symptoms      Carpal tunnel syndrome of right wrist    Advised to use wrist splint Tylenol PRN        Other   Constipation    Reports h/o IBS-C Started Senokot for now      Relevant Medications   senna-docusate (SENOKOT S) 8.6-50 MG tablet         Other Visit Diagnoses    Need for viral immunization       Relevant Orders   Tdap vaccine greater than or equal to 7yo IM (Completed)      Outpatient Encounter Medications as of 07/10/2020  Medication Sig  . acetaminophen (TYLENOL) 325 MG tablet Take 650 mg by mouth every 6 (six) hours as needed for mild pain, fever or headache.  Marland Kitchen amLODipine (NORVASC) 5 MG tablet Take 1 tablet (5 mg total) by mouth daily.  . ciprofloxacin-dexamethasone (CIPRODEX) OTIC suspension Place 4 drops into the left ear 2 (two) times daily.  Marland Kitchen omeprazole (PRILOSEC) 20 MG capsule Take 1 capsule (20 mg total) by mouth daily.  Marland Kitchen senna-docusate (SENOKOT S) 8.6-50 MG tablet Take 1 tablet by mouth daily as needed for mild constipation.  . [DISCONTINUED] Omeprazole (PRILOSEC PO) Take 1 tablet by mouth daily as needed (reflux).  . [DISCONTINUED] diclofenac (VOLTAREN) 75 MG EC tablet Take 1 tablet (75 mg total) by mouth 2 (two) times daily. (Patient not taking: Reported on 07/10/2020)  . [DISCONTINUED] lubiprostone (AMITIZA) 24 MCG capsule Take 1 capsule (24 mcg total) by mouth 2 (two) times daily with a meal. (Patient not taking: No  sig reported)  . [DISCONTINUED] methocarbamol (ROBAXIN) 500 MG tablet Take 1 tablet (500 mg total) by mouth every 8 (eight) hours as needed for muscle spasms. (Patient not taking: No sig reported)  . [DISCONTINUED] Plecanatide (TRULANCE) 3 MG TABS Take 3 mg by mouth daily. (Patient not taking: No sig reported)   No facility-administered encounter medications on file as of 07/10/2020.    Follow-up: Return in about 4 weeks (around 08/07/2020) for HTN.   Lindell Spar, MD

## 2020-07-10 NOTE — Assessment & Plan Note (Signed)
Advised to use wrist splint Tylenol PRN

## 2020-07-10 NOTE — Assessment & Plan Note (Signed)
On Omeprazole 20 mg QD 

## 2020-07-10 NOTE — Assessment & Plan Note (Signed)
Care established Previous chart reviewed History and medications reviewed with the patient 

## 2020-08-06 ENCOUNTER — Other Ambulatory Visit: Payer: Self-pay | Admitting: Internal Medicine

## 2020-08-06 DIAGNOSIS — I10 Essential (primary) hypertension: Secondary | ICD-10-CM

## 2020-08-07 ENCOUNTER — Other Ambulatory Visit: Payer: Self-pay

## 2020-08-07 ENCOUNTER — Ambulatory Visit: Payer: BC Managed Care – PPO | Admitting: Internal Medicine

## 2020-08-07 ENCOUNTER — Encounter: Payer: Self-pay | Admitting: Internal Medicine

## 2020-08-07 VITALS — BP 122/82 | HR 76 | Resp 18 | Ht 69.0 in | Wt 201.4 lb

## 2020-08-07 DIAGNOSIS — K219 Gastro-esophageal reflux disease without esophagitis: Secondary | ICD-10-CM

## 2020-08-07 DIAGNOSIS — I10 Essential (primary) hypertension: Secondary | ICD-10-CM | POA: Diagnosis not present

## 2020-08-07 DIAGNOSIS — E559 Vitamin D deficiency, unspecified: Secondary | ICD-10-CM

## 2020-08-07 DIAGNOSIS — Z114 Encounter for screening for human immunodeficiency virus [HIV]: Secondary | ICD-10-CM

## 2020-08-07 DIAGNOSIS — Z131 Encounter for screening for diabetes mellitus: Secondary | ICD-10-CM

## 2020-08-07 DIAGNOSIS — K5900 Constipation, unspecified: Secondary | ICD-10-CM

## 2020-08-07 DIAGNOSIS — K5904 Chronic idiopathic constipation: Secondary | ICD-10-CM

## 2020-08-07 DIAGNOSIS — E782 Mixed hyperlipidemia: Secondary | ICD-10-CM

## 2020-08-07 DIAGNOSIS — Z1159 Encounter for screening for other viral diseases: Secondary | ICD-10-CM | POA: Diagnosis not present

## 2020-08-07 MED ORDER — AMLODIPINE BESYLATE 5 MG PO TABS: ORAL_TABLET | ORAL | 5 refills | Status: DC

## 2020-08-07 NOTE — Assessment & Plan Note (Signed)
Reports h/o IBS-C Well-controlled with OTC plant based laxative

## 2020-08-07 NOTE — Progress Notes (Signed)
Established Patient Office Visit  Subjective:  Patient ID: Tara Wolf, female    DOB: Aug 25, 1965  Age: 55 y.o. MRN: [id number]  CC:  Chief Complaint  Patient presents with  . Hypertension    4 week follow up hypertension     HPI Tara Wolf  is a 55 year old female with PMH of HTN, GERD, IBS-C amd HLD who  presents for follow up of HTN.  HTN: BP is well-controlled. Takes Amlodipine regularly. Patient denies headache, dizziness, chest pain, dyspnea or palpitations.  Constipation: She could not get Senokot from pharmacy. She states that her symptoms are improved with OTC plant based laxative.   Past Medical History:  Diagnosis Date  . Arthritis   . Chronic constipation   . GERD (gastroesophageal reflux disease)   . H/O: hysterectomy 07/10/2020  . IBS (irritable bowel syndrome)   . Nausea and vomiting    chronic  . Syncope 05/19/2017    Past Surgical History:  Procedure Laterality Date  . ABDOMINAL HYSTERECTOMY    . BREAST BIOPSY Left    neg  . COLONOSCOPY N/A 06/23/2018   Procedure: COLONOSCOPY;  Surgeon: Danie Binder, MD;  [facility];  Service: Endoscopy;  Laterality: N/A;  1:00pm  . COLONOSCOPY WITH ESOPHAGOGASTRODUODENOSCOPY (EGD)  03/2009   Dr. Oneida Alar: Tortuous colon.  Otherwise normal colon.  Moderate sized sliding hiatal hernia.  Multiple erosions in the antrum.  Small bowel biopsies negative for celiac disease.  Marland Kitchen POLYPECTOMY  06/23/2018   Procedure: POLYPECTOMY;  Surgeon: Danie Binder, MD;  [facility]: [facility];  Service: Endoscopy;;  transverse colon polyp    Family History  Problem Relation Age of Onset  . Breast cancer Cousin   . Hypertension Mother   . Cancer Father        colon cancer, age 57  . Cancer Sister        patient doesn't know what kind  . Cancer Brother        not sure primary    Social History   Socioeconomic History  . Marital status: Single    Spouse name: Not on file  . Number of children:  Not on file  . Years of education: Not on file  . Highest education level: Not on file  Occupational History  . Not on file  Tobacco Use  . Smoking status: Never Smoker  . Smokeless tobacco: Never Used  Vaping Use  . Vaping Use: Never used  Substance and Sexual Activity  . Alcohol use: Yes    Comment: ocassional  . Drug use: No  . Sexual activity: Not on file  Other Topics Concern  . Not on file  Social History Narrative  . Not on file   Social Determinants of Health   Financial Resource Strain: Not on file  Food Insecurity: Not on file  Transportation Needs: Not on file  Physical Activity: Not on file  Stress: Not on file  Social Connections: Not on file  Intimate Partner Violence: Not on file    Outpatient Medications Prior to Visit  Medication Sig Dispense Refill  . acetaminophen (TYLENOL) 325 MG tablet Take 650 mg by mouth every 6 (six) hours as needed for mild pain, fever or headache.    Marland Kitchen omeprazole (PRILOSEC) 20 MG capsule Take 1 capsule (20 mg total) by mouth daily. 30 capsule 5  . amLODipine (NORVASC) 5 MG tablet TAKE 1 TABLET(5 MG) BY MOUTH DAILY 30 tablet 0  . ciprofloxacin-dexamethasone (CIPRODEX) OTIC  suspension Place 4 drops into the left ear 2 (two) times daily. (Patient not taking: Reported on 08/07/2020) 7.5 mL 0  . senna-docusate (SENOKOT S) 8.6-50 MG tablet Take 1 tablet by mouth daily as needed for mild constipation. (Patient not taking: Reported on 08/07/2020) 30 tablet 0   No facility-administered medications prior to visit.    No Known Allergies  ROS Review of Systems  Constitutional: Negative for chills and fever.  HENT: Positive for ear pain. Negative for congestion, ear discharge, sinus pressure, sinus pain and sore throat.   Eyes: Negative for pain and discharge.  Respiratory: Negative for cough and shortness of breath.   Cardiovascular: Negative for chest pain and palpitations.  Gastrointestinal: Positive for constipation. Negative for  abdominal pain, diarrhea, nausea and vomiting.  Endocrine: Negative for polydipsia and polyuria.  Genitourinary: Negative for dysuria and hematuria.  Musculoskeletal: Negative for neck pain and neck stiffness.  Skin: Negative for rash.  Neurological: Negative for dizziness and weakness.  Psychiatric/Behavioral: Negative for agitation and behavioral problems.      Objective:    Physical Exam Vitals reviewed.  Constitutional:      General: She is not in acute distress.    Appearance: She is not diaphoretic.  HENT:     Head: Normocephalic and atraumatic.     Nose: Nose normal.     Mouth/Throat:     Mouth: Mucous membranes are moist.  Eyes:     General: No scleral icterus.    Extraocular Movements: Extraocular movements intact.  Cardiovascular:     Rate and Rhythm: Normal rate and regular rhythm.     Pulses: Normal pulses.     Heart sounds: Normal heart sounds. No murmur heard.   Pulmonary:     Breath sounds: Normal breath sounds. No wheezing or rales.  Abdominal:     Palpations: Abdomen is soft.     Tenderness: There is no abdominal tenderness.  Musculoskeletal:     Cervical back: Neck supple. No tenderness.     Right lower leg: No edema.     Left lower leg: No edema.  Skin:    General: Skin is warm.     Findings: No rash.  Neurological:     General: No focal deficit present.     Mental Status: She is alert and oriented to person, place, and time.  Psychiatric:        Mood and Affect: Mood normal.        Behavior: Behavior normal.     BP 122/82 (BP Location: Right Arm, Patient Position: Sitting, Cuff Size: Normal)   Pulse 76   Resp 18   Ht _0  (1.753 m)   Wt 201 lb 6.4 oz (91.4 kg)   BMI 29.74 kg/m  Wt Readings from Last 3 Encounters:  08/07/20 201 lb 6.4 oz (91.4 kg)  07/10/20 203 lb (92.1 kg)  06/12/20 196 lb (88.9 kg)     Health Maintenance Due  Topic Date Due  . Hepatitis C Screening  Never done    There are no preventive care reminders to  display for this patient.  No results found for: TSH Lab Results  Component Value Date   WBC 5.4 06/12/2020   HGB 12.6 06/12/2020   HCT 39.1 06/12/2020   MCV 85.7 06/12/2020   PLT 216 06/12/2020   Lab Results  Component Value Date   NA 136 06/12/2020   K 3.7 06/12/2020   CO2 24 06/12/2020   GLUCOSE 112 (H) 06/12/2020  BUN 18 06/12/2020   CREATININE 0.72 06/12/2020   BILITOT 0.6 02/16/2018   ALKPHOS 52 02/16/2018   AST 17 02/16/2018   ALT 12 02/16/2018   PROT 8.1 02/16/2018   ALBUMIN 4.2 02/16/2018   CALCIUM 8.9 06/12/2020   ANIONGAP 9 06/12/2020   No results found for: CHOL No results found for: HDL No results found for: LDLCALC No results found for: TRIG No results found for: CHOLHDL No results found for: HGBA1C    Assessment & Plan:   Problem List Items Addressed This Visit      Cardiovascular and Mediastinum   HTN (hypertension)    BP Readings from Last 1 Encounters:  08/07/20 122/82   Well-controlled with Amlodipine 5 mg QD, refilled Counseled for compliance with the medications Advised DASH diet and moderate exercise/walking, at least 150 mins/week       Relevant Medications   amLODipine (NORVASC) 5 MG tablet   Other Relevant Orders   CBC with Differential/Platelet   CMP14+EGFR   TSH     Digestive   Gastroesophageal reflux disease    Well-controlled with Omeprazole        Other   Constipation    Reports h/o IBS-C Well-controlled with OTC plant based laxative      Hyperlipidemia, unspecified   Relevant Medications   amLODipine (NORVASC) 5 MG tablet   Other Relevant Orders   Lipid panel   Vitamin D deficiency   Relevant Orders   Vitamin D (25 hydroxy)    Other Visit Diagnoses    Need for hepatitis C screening test    -  Primary   Relevant Orders   Hepatitis C Antibody   Encounter for screening for HIV       Relevant Orders   HIV antibody (with reflex)   Screening for diabetes mellitus (DM)       Relevant Orders   HgB A1c       Meds ordered this encounter  Medications  . DISCONTD: amLODipine (NORVASC) 5 MG tablet    Sig: TAKE 1 TABLET(5 MG) BY MOUTH DAILY    Dispense:  30 tablet    Refill:  5  . amLODipine (NORVASC) 5 MG tablet    Sig: TAKE 1 TABLET(5 MG) BY MOUTH DAILY    Dispense:  30 tablet    Refill:  5    Follow-up: Return in about 6 months (around 02/06/2021).    Lindell Spar, MD

## 2020-08-07 NOTE — Patient Instructions (Signed)
Please continue taking Amlodipine as prescribed.  Please continue to follow DASH diet and perform moderate exercise/walking at least 150 mins/week.

## 2020-08-07 NOTE — Assessment & Plan Note (Signed)
Well-controlled with Omeprazole 

## 2020-08-07 NOTE — Assessment & Plan Note (Signed)
BP Readings from Last 1 Encounters:  08/07/20 122/82   Well-controlled with Amlodipine 5 mg QD, refilled Counseled for compliance with the medications Advised DASH diet and moderate exercise/walking, at least 150 mins/week

## 2020-08-13 ENCOUNTER — Other Ambulatory Visit: Payer: Self-pay

## 2020-08-13 ENCOUNTER — Ambulatory Visit
Admission: EM | Admit: 2020-08-13 | Discharge: 2020-08-13 | Disposition: A | Discharge status: Home / Self Care | Payer: BC Managed Care – PPO | Attending: Family Medicine | Admitting: Family Medicine

## 2020-08-13 DIAGNOSIS — Z1152 Encounter for screening for COVID-19: Secondary | ICD-10-CM | POA: Diagnosis not present

## 2020-08-13 NOTE — ED Triage Notes (Signed)
covid test for travel

## 2020-08-14 LAB — NOVEL CORONAVIRUS, NAA: SARS-CoV-2, NAA: NOT DETECTED

## 2020-09-05 ENCOUNTER — Other Ambulatory Visit: Payer: Self-pay | Admitting: Internal Medicine

## 2020-09-05 DIAGNOSIS — I10 Essential (primary) hypertension: Secondary | ICD-10-CM

## 2020-09-06 ENCOUNTER — Other Ambulatory Visit: Payer: Self-pay | Admitting: Internal Medicine

## 2020-09-06 DIAGNOSIS — I10 Essential (primary) hypertension: Secondary | ICD-10-CM

## 2020-12-14 ENCOUNTER — Other Ambulatory Visit: Payer: Self-pay | Admitting: Internal Medicine

## 2020-12-14 DIAGNOSIS — K219 Gastro-esophageal reflux disease without esophagitis: Secondary | ICD-10-CM

## 2021-02-06 ENCOUNTER — Ambulatory Visit: Payer: BC Managed Care – PPO | Admitting: Internal Medicine

## 2021-02-18 ENCOUNTER — Encounter: Payer: Self-pay | Admitting: Internal Medicine

## 2021-02-18 ENCOUNTER — Other Ambulatory Visit: Payer: Self-pay

## 2021-02-18 ENCOUNTER — Ambulatory Visit: Payer: BC Managed Care – PPO | Admitting: Internal Medicine

## 2021-02-18 VITALS — BP 152/85 | HR 64 | Resp 18 | Ht 67.0 in | Wt 203.1 lb

## 2021-02-18 DIAGNOSIS — Z131 Encounter for screening for diabetes mellitus: Secondary | ICD-10-CM

## 2021-02-18 DIAGNOSIS — R0789 Other chest pain: Secondary | ICD-10-CM | POA: Diagnosis not present

## 2021-02-18 DIAGNOSIS — Z1159 Encounter for screening for other viral diseases: Secondary | ICD-10-CM | POA: Diagnosis not present

## 2021-02-18 DIAGNOSIS — K219 Gastro-esophageal reflux disease without esophagitis: Secondary | ICD-10-CM

## 2021-02-18 DIAGNOSIS — E782 Mixed hyperlipidemia: Secondary | ICD-10-CM

## 2021-02-18 DIAGNOSIS — G5601 Carpal tunnel syndrome, right upper limb: Secondary | ICD-10-CM | POA: Diagnosis not present

## 2021-02-18 DIAGNOSIS — Z23 Encounter for immunization: Secondary | ICD-10-CM

## 2021-02-18 DIAGNOSIS — I10 Essential (primary) hypertension: Secondary | ICD-10-CM | POA: Diagnosis not present

## 2021-02-18 MED ORDER — HYDROCHLOROTHIAZIDE 12.5 MG PO CAPS
12.5000 mg | ORAL_CAPSULE | Freq: Every day | ORAL | 2 refills | Status: DC
Start: 2021-02-18 — End: 2021-04-18

## 2021-02-18 NOTE — Patient Instructions (Signed)
Please start taking HCTZ in addition to Amlodipine for BP and it should help you with leg swelling as well.  Please continue to follow low salt diet and ambulate as tolerated.

## 2021-02-18 NOTE — Progress Notes (Signed)
Established Patient Office Visit  Subjective:  Patient ID: Tara Wolf, female    DOB: [birth year]  Age: 55 y.o. MRN: [id number]  CC:  Chief Complaint  Patient presents with   Follow-up    6 month follow up pt left arm has been aching last few months     HPI Tara Wolf is a 55 y.o. female with past medical history of HTN, GERD, IBS-C and HLD who presents for follow up of her chronic medical conditions and c/o chest pain.  She has been having episodes of substernal chest pain and left arm heaviness, which are intermittent and also at rest. She denies any excessive sweating, vision changes/dizziness or palpitations.  HTN: Her BP was elevated today upon multiple measurements. She has been taking Amlodipine. She denies any headache, dizziness, dyspnea or palpitations currently. She does report chronic b/l leg swelling.  She has h/o HLD, but not on a statin currently.  She received flu vaccine in the office today.  Past Medical History:  Diagnosis Date   Arthritis    Chronic constipation    GERD (gastroesophageal reflux disease)    H/O: hysterectomy 07/10/2020   IBS (irritable bowel syndrome)    Nausea and vomiting    chronic   Syncope 05/19/2017    Past Surgical History:  Procedure Laterality Date   ABDOMINAL HYSTERECTOMY     BREAST BIOPSY Left    neg   COLONOSCOPY N/A 06/23/2018   Procedure: COLONOSCOPY;  Surgeon: Danie Binder, MD;  [facility];  Service: Endoscopy;  Laterality: N/A;  1:00pm   COLONOSCOPY WITH ESOPHAGOGASTRODUODENOSCOPY (EGD)  03/2009   Dr. Oneida Alar: Tortuous colon.  Otherwise normal colon.  Moderate sized sliding hiatal hernia.  Multiple erosions in the antrum.  Small bowel biopsies negative for celiac disease.   POLYPECTOMY  06/23/2018   Procedure: POLYPECTOMY;  Surgeon: Danie Binder, MD;  [facility];  Service: Endoscopy;;  transverse colon polyp    Family History  Problem Relation Age of Onset   Breast  cancer Cousin    Hypertension Mother    Cancer Father        colon cancer, age 31   Cancer Sister        patient doesn't know what kind   Cancer Brother        not sure primary    Social History   Socioeconomic History   Marital status: Single    Spouse name: Not on file   Number of children: Not on file   Years of education: Not on file   Highest education level: Not on file  Occupational History   Not on file  Tobacco Use   Smoking status: Never   Smokeless tobacco: Never  Vaping Use   Vaping Use: Never used  Substance and Sexual Activity   Alcohol use: Yes    Comment: ocassional   Drug use: No   Sexual activity: Not on file  Other Topics Concern   Not on file  Social History Narrative   Not on file   Social Determinants of Health   Financial Resource Strain: Not on file  Food Insecurity: Not on file  Transportation Needs: Not on file  Physical Activity: Not on file  Stress: Not on file  Social Connections: Not on file  Intimate Partner Violence: Not on file    Outpatient Medications Prior to Visit  Medication Sig Dispense Refill   acetaminophen (TYLENOL) 325 MG tablet Take 650 mg by  mouth every 6 (six) hours as needed for mild pain, fever or headache.     amLODipine (NORVASC) 5 MG tablet TAKE 1 TABLET(5 MG) BY MOUTH DAILY 30 tablet 5   omeprazole (PRILOSEC) 20 MG capsule TAKE 1 CAPSULE(20 MG) BY MOUTH DAILY 30 capsule 5   No facility-administered medications prior to visit.    No Known Allergies  ROS Review of Systems  Constitutional:  Negative for chills and fever.  HENT:  Negative for congestion, ear discharge, sinus pressure, sinus pain and sore throat.   Eyes:  Negative for pain and discharge.  Respiratory:  Negative for cough and shortness of breath.   Cardiovascular:  Positive for chest pain and leg swelling. Negative for palpitations.  Gastrointestinal:  Positive for constipation. Negative for abdominal pain, diarrhea, nausea and vomiting.   Endocrine: Negative for polydipsia and polyuria.  Genitourinary:  Negative for dysuria and hematuria.  Musculoskeletal:  Negative for neck pain and neck stiffness.  Skin:  Negative for rash.  Neurological:  Negative for dizziness and weakness.  Psychiatric/Behavioral:  Negative for agitation and behavioral problems.      Objective:    Physical Exam Vitals reviewed.  Constitutional:      General: She is not in acute distress.    Appearance: She is not diaphoretic.  HENT:     Head: Normocephalic and atraumatic.     Nose: Nose normal.     Mouth/Throat:     Mouth: Mucous membranes are moist.  Eyes:     General: No scleral icterus.    Extraocular Movements: Extraocular movements intact.  Cardiovascular:     Rate and Rhythm: Normal rate and regular rhythm.     Pulses: Normal pulses.     Heart sounds: Normal heart sounds. No murmur heard. Pulmonary:     Breath sounds: Normal breath sounds. No wheezing or rales.  Abdominal:     Palpations: Abdomen is soft.     Tenderness: There is no abdominal tenderness.  Musculoskeletal:     Cervical back: Neck supple. No tenderness.     Right lower leg: Edema (Mild) present.     Left lower leg: Edema (Mild) present.  Skin:    General: Skin is warm.     Findings: No rash.  Neurological:     General: No focal deficit present.     Mental Status: She is alert and oriented to person, place, and time.  Psychiatric:        Mood and Affect: Mood normal.        Behavior: Behavior normal.    BP (!) 152/85 (BP Location: Left Arm, Patient Position: Sitting, Cuff Size: Normal)   Pulse 64   Resp 18   Ht '5\' 7"'  (1.702 m)   Wt 203 lb 1.9 oz (92.1 kg)   SpO2 100%   BMI 31.81 kg/m  Wt Readings from Last 3 Encounters:  02/18/21 203 lb 1.9 oz (92.1 kg)  08/07/20 201 lb 6.4 oz (91.4 kg)  07/10/20 203 lb (92.1 kg)    No results found for: TSH Lab Results  Component Value Date   WBC 5.4 06/12/2020   HGB 12.6 06/12/2020   HCT 39.1 06/12/2020    MCV 85.7 06/12/2020   PLT 216 06/12/2020   Lab Results  Component Value Date   NA 136 06/12/2020   K 3.7 06/12/2020   CO2 24 06/12/2020   GLUCOSE 112 (H) 06/12/2020   BUN 18 06/12/2020   CREATININE 0.72 06/12/2020   BILITOT 0.6 02/16/2018  ALKPHOS 52 02/16/2018   AST 17 02/16/2018   ALT 12 02/16/2018   PROT 8.1 02/16/2018   ALBUMIN 4.2 02/16/2018   CALCIUM 8.9 06/12/2020   ANIONGAP 9 06/12/2020   No results found for: CHOL No results found for: HDL No results found for: LDLCALC No results found for: TRIG No results found for: CHOLHDL No results found for: HGBA1C    Assessment & Plan:   Problem List Items Addressed This Visit       Cardiovascular and Mediastinum   HTN (hypertension)   Relevant Medications   hydrochlorothiazide (MICROZIDE) 12.5 MG capsule   Other Relevant Orders   CBC with Differential/Platelet   CMP14+EGFR     Digestive   Gastroesophageal reflux disease     Nervous and Auditory   Carpal tunnel syndrome of right wrist   Relevant Orders   CBC with Differential/Platelet     Other   Hyperlipidemia, unspecified   Relevant Medications   hydrochlorothiazide (MICROZIDE) 12.5 MG capsule   Other Relevant Orders   Lipid panel   Atypical chest pain - Primary   Relevant Orders   Ambulatory referral to Cardiology   CMP14+EGFR   Other Visit Diagnoses     Need for hepatitis C screening test       Relevant Orders   Hepatitis C Antibody   Encounter for screening for diabetes mellitus       Relevant Orders   Hemoglobin A1c       Meds ordered this encounter  Medications   hydrochlorothiazide (MICROZIDE) 12.5 MG capsule    Sig: Take 1 capsule (12.5 mg total) by mouth daily.    Dispense:  30 capsule    Refill:  2    Follow-up: No follow-ups on file.    Lindell Spar, MD

## 2021-02-19 LAB — CBC WITH DIFFERENTIAL/PLATELET
Basophils Absolute: 0 10*3/uL (ref 0.0–0.2)
Basos: 1 %
EOS (ABSOLUTE): 0.1 10*3/uL (ref 0.0–0.4)
Eos: 1 %
Hematocrit: 39.4 % (ref 34.0–46.6)
Hemoglobin: 12.7 g/dL (ref 11.1–15.9)
Immature Grans (Abs): 0 10*3/uL (ref 0.0–0.1)
Immature Granulocytes: 0 %
Lymphocytes Absolute: 2.4 10*3/uL (ref 0.7–3.1)
Lymphs: 42 %
MCH: 26.8 pg (ref 26.6–33.0)
MCHC: 32.2 g/dL (ref 31.5–35.7)
MCV: 83 fL (ref 79–97)
Monocytes Absolute: 0.4 10*3/uL (ref 0.1–0.9)
Monocytes: 7 %
Neutrophils Absolute: 2.8 10*3/uL (ref 1.4–7.0)
Neutrophils: 49 %
Platelets: 258 10*3/uL (ref 150–450)
RBC: 4.74 x10E6/uL (ref 3.77–5.28)
RDW: 13.8 % (ref 11.7–15.4)
WBC: 5.7 10*3/uL (ref 3.4–10.8)

## 2021-02-19 LAB — CMP14+EGFR
ALT: 14 IU/L (ref 0–32)
AST: 16 IU/L (ref 0–40)
Albumin/Globulin Ratio: 1.7 (ref 1.2–2.2)
Albumin: 4.6 g/dL (ref 3.8–4.9)
Alkaline Phosphatase: 74 IU/L (ref 44–121)
BUN/Creatinine Ratio: 15 (ref 9–23)
BUN: 13 mg/dL (ref 6–24)
Bilirubin Total: 0.3 mg/dL (ref 0.0–1.2)
CO2: 22 mmol/L (ref 20–29)
Calcium: 9.5 mg/dL (ref 8.7–10.2)
Chloride: 102 mmol/L (ref 96–106)
Creatinine, Ser: 0.86 mg/dL (ref 0.57–1.00)
Globulin, Total: 2.7 g/dL (ref 1.5–4.5)
Glucose: 94 mg/dL (ref 70–99)
Potassium: 4.1 mmol/L (ref 3.5–5.2)
Sodium: 136 mmol/L (ref 134–144)
Total Protein: 7.3 g/dL (ref 6.0–8.5)
eGFR: 81 mL/min/{1.73_m2} (ref >59)

## 2021-02-19 LAB — LIPID PANEL
Chol/HDL Ratio: 4.6 ratio — ABNORMAL HIGH (ref 0.0–4.4)
Cholesterol, Total: 214 mg/dL — ABNORMAL HIGH (ref 100–199)
HDL: 47 mg/dL (ref >39)
LDL Chol Calc (NIH): 143 mg/dL — ABNORMAL HIGH (ref 0–99)
Triglycerides: 132 mg/dL (ref 0–149)
VLDL Cholesterol Cal: 24 mg/dL (ref 5–40)

## 2021-02-19 LAB — HEMOGLOBIN A1C
Est. average glucose Bld gHb Est-mCnc: 131 mg/dL
Hgb A1c MFr Bld: 6.2 % — ABNORMAL HIGH (ref 4.8–5.6)

## 2021-02-19 LAB — HEPATITIS C ANTIBODY: Hep C Virus Ab: <0.1 s/co ratio (ref 0.0–0.9)

## 2021-02-19 MED ORDER — ROSUVASTATIN CALCIUM 5 MG PO TABS: 5.0000 mg | ORAL_TABLET | Freq: Every day | ORAL | 1 refills | Status: DC

## 2021-02-21 NOTE — Assessment & Plan Note (Signed)
BP Readings from Last 1 Encounters:  02/18/21 (!) 152/85   Uncontrolled with Amlodipine 5 mg QD Added HCTZ 12.5 mg QD Counseled for compliance with the medications Advised DASH diet and moderate exercise/walking, at least 150 mins/week

## 2021-02-21 NOTE — Assessment & Plan Note (Signed)
Chest pain at rest, with left arm heaviness EKG: Sinus rhythm. No signs of active ischemia.  Considering radiating chest pain at rest, referred to Cardiology

## 2021-02-21 NOTE — Assessment & Plan Note (Signed)
Started Crestor Lipid profile reviewed

## 2021-02-21 NOTE — Assessment & Plan Note (Signed)
Advised to use wrist splint Tylenol PRN If persistent, will refer to Orthopedic surgeon

## 2021-02-21 NOTE — Assessment & Plan Note (Signed)
Well-controlled with Omeprazole 

## 2021-03-04 ENCOUNTER — Ambulatory Visit (INDEPENDENT_AMBULATORY_CARE_PROVIDER_SITE_OTHER): Payer: BC Managed Care – PPO | Admitting: Cardiology

## 2021-03-04 ENCOUNTER — Encounter: Payer: Self-pay | Admitting: Cardiology

## 2021-03-04 ENCOUNTER — Other Ambulatory Visit: Payer: Self-pay

## 2021-03-04 VITALS — BP 122/76 | HR 66 | Ht 67.0 in | Wt 197.8 lb

## 2021-03-04 DIAGNOSIS — R079 Chest pain, unspecified: Secondary | ICD-10-CM | POA: Diagnosis not present

## 2021-03-04 DIAGNOSIS — E785 Hyperlipidemia, unspecified: Secondary | ICD-10-CM

## 2021-03-04 DIAGNOSIS — I10 Essential (primary) hypertension: Secondary | ICD-10-CM | POA: Diagnosis not present

## 2021-03-04 DIAGNOSIS — R7303 Prediabetes: Secondary | ICD-10-CM | POA: Diagnosis not present

## 2021-03-04 DIAGNOSIS — E669 Obesity, unspecified: Secondary | ICD-10-CM

## 2021-03-04 DIAGNOSIS — Z79899 Other long term (current) drug therapy: Secondary | ICD-10-CM

## 2021-03-04 DIAGNOSIS — R0789 Other chest pain: Secondary | ICD-10-CM

## 2021-03-04 MED ORDER — NITROGLYCERIN 0.4 MG SL SUBL: 0.4000 mg | SUBLINGUAL_TABLET | SUBLINGUAL | 3 refills | Status: AC | PRN

## 2021-03-04 NOTE — Patient Instructions (Addendum)
Medication Instructions:  Your physician has recommended you make the following change in your medication:  START: Nitroglycerin 0.4 mg take one tablet by mouth every 5 minutes up to three times as needed for chest pain  *If you need a refill on your cardiac medications before your next appointment, please call your pharmacy*   Lab Work: Your physician recommends that you return for lab work in:  TODAY: BMET [place] If you have labs (blood work) drawn today and your tests are completely normal, you will receive your results only by: [employer] (if you have MyChart) OR A paper copy in the mail If you have any lab test that is abnormal or we need to change your treatment, we will call you to review the results.   Testing/Procedures:   Your cardiac CT will be scheduled at one of the below locations:   [facility] [address] [place], [place] [address] [phone]  If scheduled at [facility], please arrive at the [facility] main entrance (entrance A) of [facility] 30 minutes prior to test start time. You can use the FREE valet parking offered at the main entrance (encouraged to control the heart rate for the test) Proceed to the [facility] Radiology Department (first floor) to check-in and test prep.   Please follow these instructions carefully (unless otherwise directed):    On the Night Before the Test: Be sure to Drink plenty of water. Do not consume any caffeinated/decaffeinated beverages or chocolate 12 hours prior to your test. Do not take any antihistamines 12 hours prior to your test.   On the Day of the Test: Drink plenty of water until 1 hour prior to the test. Do not eat any food 4 hours prior to the test. You may take your regular medications prior to the test.  Take metoprolol (Lopressor) two hours prior to test. HOLD Hydrochlorothiazide and Amlodipine (Norvasc) morning of the test. FEMALES- please wear underwire-free bra  if available, avoid dresses & tight clothing      After the Test: Drink plenty of water. After receiving IV contrast, you may experience a mild flushed feeling. This is normal. On occasion, you may experience a mild rash up to 24 hours after the test. This is not dangerous. If this occurs, you can take Benadryl 25 mg and increase your fluid intake. If you experience trouble breathing, this can be serious. If it is severe call 911 IMMEDIATELY. If it is mild, please call our office. If you take any of these medications: Glipizide/Metformin, Avandament, Glucavance, please do not take 48 hours after completing test unless otherwise instructed.  Please allow 2-4 weeks for scheduling of routine cardiac CTs. Some insurance companies require a pre-authorization which may delay scheduling of this test.   For non-scheduling related questions, please contact the cardiac imaging nurse navigator should you have any questions/concerns: Marchia Bond, Cardiac Imaging Nurse Navigator Gordy Clement, Cardiac Imaging Nurse Navigator [facility] Direct Office Dial: [phone]   For scheduling needs, including cancellations and rescheduling, please call [place], [phone].    Follow-Up: At [facility], you and your health needs are our priority.  As part of our continuing mission to provide you with exceptional heart care, we have created designated Provider Care Teams.  These Care Teams include your primary Cardiologist (physician) and Advanced Practice Providers (APPs -  Physician Assistants and Nurse Practitioners) who all work together to provide you with the care you need, when you  need it.  We recommend signing up for the patient portal called "MyChart".  Sign up information is provided on this After Visit Summary.  MyChart is used to connect with patients for Virtual Visits (Telemedicine).  Patients are able to view lab/test results, encounter notes, upcoming  appointments, etc.  Non-urgent messages can be sent to your provider as well.   To learn more about what you can do with MyChart, go to [email or web address].    Your next appointment:   6 month(s)  The format for your next appointment:   In Person  Provider:   Arlyn Dunning, DO   Other Instructions

## 2021-03-04 NOTE — Progress Notes (Signed)
Cardiology Office Note:    Date:  03/04/2021   ID:  Tara Wolf, DOB 03/02/1966, MRN [id number]  PCP:  Tara Spar, MD  Cardiologist:  Berniece Salines, DO  Electrophysiologist:  None   Referring MD: Tara Spar, MD   " I am having chest pain"  History of Present Illness:    Tara Wolf is a 55 y.o. female with a hx of diabetes, hypertension, hyperlipidemia, and GERD is here today to be evaluated for chest discomfort.  She tells me that for many years she has had intermittent chest discomfort.  She does describe it as left-sided pressure-like sensation at first.  It would come and go and last for few minutes and resolved.  Recently she says that the frequency of this chest discomfort is increased and she has been having radiation sometimes of her shoulder and down her arms.  She is got some numbness and tingling as well.  She also admits to shortness of breath as well.  No lightheadedness or dizziness.  Past Medical History:  Diagnosis Date   Arthritis    Chronic constipation    GERD (gastroesophageal reflux disease)    H/O: hysterectomy 07/10/2020   IBS (irritable bowel syndrome)    Nausea and vomiting    chronic   Syncope 05/19/2017    Past Surgical History:  Procedure Laterality Date   ABDOMINAL HYSTERECTOMY     BREAST BIOPSY Left    neg   COLONOSCOPY N/A 06/23/2018   Procedure: COLONOSCOPY;  Surgeon: Tara Binder, MD;  [facility];  Service: Endoscopy;  Laterality: N/A;  1:00pm   COLONOSCOPY WITH ESOPHAGOGASTRODUODENOSCOPY (EGD)  03/2009   Dr. Oneida Wolf: Tortuous colon.  Otherwise normal colon.  Moderate sized sliding hiatal hernia.  Multiple erosions in the antrum.  Small bowel biopsies negative for celiac disease.   POLYPECTOMY  06/23/2018   Procedure: POLYPECTOMY;  Surgeon: Tara Binder, MD;  [facility]: [facility];  Service: Endoscopy;;  transverse colon polyp    Current Medications: Current Meds  Medication Sig   acetaminophen  (TYLENOL) 325 MG tablet Take 650 mg by mouth every 6 (six) hours as needed for mild pain, fever or headache.   amLODipine (NORVASC) 5 MG tablet TAKE 1 TABLET(5 MG) BY MOUTH DAILY   ASPIRIN 81 PO Take by mouth daily.   hydrochlorothiazide (MICROZIDE) 12.5 MG capsule Take 1 capsule (12.5 mg total) by mouth daily.   nitroGLYCERIN (NITROSTAT) 0.4 MG SL tablet Place 1 tablet (0.4 mg total) under the tongue every 5 (five) minutes as needed for chest pain.   omeprazole (PRILOSEC) 20 MG capsule TAKE 1 CAPSULE(20 MG) BY MOUTH DAILY   rosuvastatin (CRESTOR) 5 MG tablet Take 1 tablet (5 mg total) by mouth daily.     Allergies:   Patient has no known allergies.   Social History   Socioeconomic History   Marital status: Single    Spouse name: Not on file   Number of children: Not on file   Years of education: Not on file   Highest education level: Not on file  Occupational History   Not on file  Tobacco Use   Smoking status: Never   Smokeless tobacco: Never  Vaping Use   Vaping Use: Never used  Substance and Sexual Activity   Alcohol use: Yes    Comment: ocassional   Drug use: No   Sexual activity: Not on file  Other Topics Concern   Not on file  Social History Narrative  Not on file   Social Determinants of Health   Financial Resource Strain: Not on file  Food Insecurity: Not on file  Transportation Needs: Not on file  Physical Activity: Not on file  Stress: Not on file  Social Connections: Not on file     Family History: The patient's family history includes Breast cancer in her cousin; Cancer in her brother, father, and sister; Hypertension in her mother.  ROS:   Review of Systems  Constitution: Negative for decreased appetite, fever and weight gain.  HENT: Negative for congestion, ear discharge, hoarse voice and sore throat.   Eyes: Negative for discharge, redness, vision loss in right eye and visual halos.  Cardiovascular: Reports chest pain, dyspnea on exertion,.   Negative for leg swelling, orthopnea and palpitations.  Respiratory: Negative for cough, hemoptysis, shortness of breath and snoring.   Endocrine: Negative for heat intolerance and polyphagia.  Hematologic/Lymphatic: Negative for bleeding problem. Does not bruise/bleed easily.  Skin: Negative for flushing, nail changes, rash and suspicious lesions.  Musculoskeletal: Negative for arthritis, joint pain, muscle cramps, myalgias, neck pain and stiffness.  Gastrointestinal: Negative for abdominal pain, bowel incontinence, diarrhea and excessive appetite.  Genitourinary: Negative for decreased libido, genital sores and incomplete emptying.  Neurological: Negative for brief paralysis, focal weakness, headaches and loss of balance.  Psychiatric/Behavioral: Negative for altered mental status, depression and suicidal ideas.  Allergic/Immunologic: Negative for HIV exposure and persistent infections.    EKGs/Labs/Other Studies Reviewed:    The following studies were reviewed today:   EKG:  The ekg ordered today demonstrates sinus rhythm, heart rate 66 bpm.  Recent Labs: 02/18/2021: ALT 14; BUN 13; Creatinine, Ser 0.86; Hemoglobin 12.7; Platelets 258; Potassium 4.1; Sodium 136  Recent Lipid Panel    Component Value Date/Time   CHOL 214 (H) 02/18/2021 1056   TRIG 132 02/18/2021 1056   HDL 47 02/18/2021 1056   CHOLHDL 4.6 (H) 02/18/2021 1056   LDLCALC 143 (H) 02/18/2021 1056    Physical Exam:    VS:  BP 122/76 (BP Location: Right Arm)   Pulse 66   Ht 5\' 7"  (1.702 m)   Wt 197 lb 12.8 oz (89.7 kg)   BMI 30.98 kg/m     Wt Readings from Last 3 Encounters:  03/04/21 197 lb 12.8 oz (89.7 kg)  02/18/21 203 lb 1.9 oz (92.1 kg)  08/07/20 201 lb 6.4 oz (91.4 kg)     GEN: Well nourished, well developed in no acute distress HEENT: Normal NECK: No JVD; No carotid bruits LYMPHATICS: No lymphadenopathy CARDIAC: S1S2 noted,RRR, no murmurs, rubs, gallops RESPIRATORY:  Clear to auscultation without  rales, wheezing or rhonchi  ABDOMEN: Soft, non-tender, non-distended, +bowel sounds, no guarding. EXTREMITIES: No edema, No cyanosis, no clubbing MUSCULOSKELETAL:  No deformity  SKIN: Warm and dry, skin with tattoos NEUROLOGIC:  Alert and oriented x 3, non-focal PSYCHIATRIC:  Normal affect, good insight  ASSESSMENT:    1. Chest pain of uncertain etiology   2. Hyperlipidemia, unspecified hyperlipidemia type   3. Primary hypertension   4. Prediabetes   5. Other chest pain   6. Medication management   7. Obesity (BMI 30-39.9)    PLAN:     1.  The symptoms chest pain is concerning, this patient does have intermediate risk for coronary artery disease and at this time I would like to pursue an ischemic evaluation in this patient.  Shared decision a coronary CTA at this time is appropriate.  I have discussed with the patient about  the testing.  The patient has no IV contrast allergy and is agreeable to proceed with this test. Sublingual nitroglycerin prescription was sent, its protocol and 911 protocol explained and the patient vocalized understanding questions were answered to the patient's satisfaction  2.  Blood pressure is acceptable, continue with current antihypertensive regimen.  3.  Hyperlipidemia - continue with current statin medication.  Her most recent lipid profile on February 19, 2019 showed total cholesterol 214, LDL 143, HDL 47, triglyceride 37.  Her LDL goal is less than 130.  She recently was started on rosuvastatin 5 mg daily.  We will wait after her repeat blood work to make any adjustments to her lipid profile.  4.  Prediabetes-most recent hemoglobin A1c 6.2.  The patient is aware about this diagnosis.  She has been watching her diet as well as doing the best she can to exercise.  5.  The patient understands the need to lose weight with diet and exercise. We have discussed specific strategies for this.  The patient is in agreement with the above plan. The patient left the  office in stable condition.  The patient will follow up in 6 months or sooner if needed.   Medication Adjustments/Labs and Tests Ordered: Current medicines are reviewed at length with the patient today.  Concerns regarding medicines are outlined above.  Orders Placed This Encounter  Procedures   CT CORONARY MORPH W/CTA COR W/SCORE W/CA W/CM &/OR WO/CM   Basic Metabolic Panel (BMET)   Magnesium   EKG 12-Lead   Meds ordered this encounter  Medications   nitroGLYCERIN (NITROSTAT) 0.4 MG SL tablet    Sig: Place 1 tablet (0.4 mg total) under the tongue every 5 (five) minutes as needed for chest pain.    Dispense:  90 tablet    Refill:  3    Patient Instructions  Medication Instructions:  Your physician has recommended you make the following change in your medication:  START: Nitroglycerin 0.4 mg take one tablet by mouth every 5 minutes up to three times as needed for chest pain  *If you need a refill on your cardiac medications before your next appointment, please call your pharmacy*   Lab Work: Your physician recommends that you return for lab work in:  TODAY: BMET [place] If you have labs (blood work) drawn today and your tests are completely normal, you will receive your results only by: [employer] (if you have MyChart) OR A paper copy in the mail If you have any lab test that is abnormal or we need to change your treatment, we will call you to review the results.   Testing/Procedures:   Your cardiac CT will be scheduled at one of the below locations:   [facility] [address] [place],  [address] [phone]  If scheduled at [facility], please arrive at the [facility] main entrance (entrance A) of [facility] 30 minutes prior to test start time. You can use the FREE valet parking offered at the main entrance (encouraged to control the heart rate for the test) Proceed to the [facility] Radiology Department (first floor) to  check-in and test prep.   Please follow these instructions carefully (unless otherwise directed):    On the Night Before the Test: Be sure to Drink plenty of water. Do not consume any caffeinated/decaffeinated beverages or chocolate 12 hours prior to your test. Do not take any antihistamines 12 hours prior to your test.   On the Day  of the Test: Drink plenty of water until 1 hour prior to the test. Do not eat any food 4 hours prior to the test. You may take your regular medications prior to the test.  Take metoprolol (Lopressor) two hours prior to test. HOLD Hydrochlorothiazide and Amlodipine (Norvasc) morning of the test. FEMALES- please wear underwire-free bra if available, avoid dresses & tight clothing      After the Test: Drink plenty of water. After receiving IV contrast, you may experience a mild flushed feeling. This is normal. On occasion, you may experience a mild rash up to 24 hours after the test. This is not dangerous. If this occurs, you can take Benadryl 25 mg and increase your fluid intake. If you experience trouble breathing, this can be serious. If it is severe call 911 IMMEDIATELY. If it is mild, please call our office. If you take any of these medications: Glipizide/Metformin, Avandament, Glucavance, please do not take 48 hours after completing test unless otherwise instructed.  Please allow 2-4 weeks for scheduling of routine cardiac CTs. Some insurance companies require a pre-authorization which may delay scheduling of this test.   For non-scheduling related questions, please contact the cardiac imaging nurse navigator should you have any questions/concerns: Marchia Bond, Cardiac Imaging Nurse Navigator Gordy Clement, Cardiac Imaging Nurse Navigator [facility] Direct Office Dial: [phone]   For scheduling needs, including cancellations and rescheduling, please call [place], [phone].    Follow-Up: At [facility],  you and your health needs are our priority.  As part of our continuing mission to provide you with exceptional heart care, we have created designated Provider Care Teams.  These Care Teams include your primary Cardiologist (physician) and Advanced Practice Providers (APPs -  Physician Assistants and Nurse Practitioners) who all work together to provide you with the care you need, when you need it.  We recommend signing up for the patient portal called "MyChart".  Sign up information is provided on this After Visit Summary.  MyChart is used to connect with patients for Virtual Visits (Telemedicine).  Patients are able to view lab/test results, encounter notes, upcoming appointments, etc.  Non-urgent messages can be sent to your provider as well.   To learn more about what you can do with MyChart, go to [email or web address].    Your next appointment:   6 month(s)  The format for your next appointment:   In Person  Provider:   Arlyn Dunning, DO   Other Instructions     Adopting a Healthy Lifestyle.  Know what a healthy weight is for you (roughly BMI <25) and aim to maintain this   Aim for 7+ servings of fruits and vegetables daily   65-80+ fluid ounces of water or unsweet tea for healthy kidneys   Limit to max 1 drink of alcohol per day; avoid smoking/tobacco   Limit animal fats in diet for cholesterol and heart health - choose grass fed whenever available   Avoid highly processed foods, and foods high in saturated/trans fats   Aim for low stress - take time to unwind and care for your mental health   Aim for 150 min of moderate intensity exercise weekly for heart health, and weights twice weekly for bone health   Aim for 7-9 hours of sleep daily   When it comes to diets, agreement about the perfect plan isnt easy to find, even among the experts. Experts at the [employer] developed an idea known as the Healthy  Eating Plate. Just imagine a plate divided into  logical, healthy portions.   The emphasis is on diet quality:   Load up on vegetables and fruits - one-half of your plate: Aim for color and variety, and remember that potatoes dont count.   Go for whole grains - one-quarter of your plate: Whole wheat, barley, wheat berries, quinoa, oats, brown rice, and foods made with them. If you want pasta, go with whole wheat pasta.   Protein power - one-quarter of your plate: Fish, chicken, beans, and nuts are all healthy, versatile protein sources. Limit red meat.   The diet, however, does go beyond the plate, offering a few other suggestions.   Use healthy plant oils, such as olive, canola, soy, corn, sunflower and peanut. Check the labels, and avoid partially hydrogenated oil, which have unhealthy trans fats.   If youre thirsty, drink water. Coffee and tea are good in moderation, but skip sugary drinks and limit milk and dairy products to one or two daily servings.   The type of carbohydrate in the diet is more important than the amount. Some sources of carbohydrates, such as vegetables, fruits, whole grains, and beans-are healthier than others.   Finally, stay active  Signed, Berniece Salines, DO  03/04/2021 10:12 PM    [facility]

## 2021-03-05 LAB — BASIC METABOLIC PANEL
BUN/Creatinine Ratio: 17 (ref 9–23)
BUN: 15 mg/dL (ref 6–24)
CO2: 26 mmol/L (ref 20–29)
Calcium: 9.8 mg/dL (ref 8.7–10.2)
Chloride: 99 mmol/L (ref 96–106)
Creatinine, Ser: 0.89 mg/dL (ref 0.57–1.00)
Glucose: 82 mg/dL (ref 70–99)
Potassium: 3.7 mmol/L (ref 3.5–5.2)
Sodium: 139 mmol/L (ref 134–144)
eGFR: 77 mL/min/{1.73_m2} (ref >59)

## 2021-03-05 LAB — MAGNESIUM: Magnesium: 2.2 mg/dL (ref 1.6–2.3)

## 2021-03-20 ENCOUNTER — Telehealth (HOSPITAL_COMMUNITY): Payer: Self-pay | Admitting: *Deleted

## 2021-03-20 NOTE — Telephone Encounter (Signed)
Reaching out to patient to offer assistance regarding upcoming cardiac imaging study; pt verbalizes understanding of appt date/time, parking situation and where to check in, and verified current allergies; name and call back number provided for further questions should they arise  Gordy Clement RN Navigator [facility] [phone] office [phone] cell  Patient aware to arrive at 11am for her 11:30am scan. Reports her veins are hard to find.

## 2021-03-21 ENCOUNTER — Other Ambulatory Visit: Payer: Self-pay

## 2021-03-21 ENCOUNTER — Ambulatory Visit (HOSPITAL_COMMUNITY)
Admission: RE | Admit: 2021-03-21 | Discharge: 2021-03-21 | Disposition: A | Discharge status: Home / Self Care | Payer: BC Managed Care – PPO | Source: Ambulatory Visit | Attending: Cardiology | Admitting: Cardiology

## 2021-03-21 DIAGNOSIS — R0789 Other chest pain: Secondary | ICD-10-CM | POA: Insufficient documentation

## 2021-03-21 MED ORDER — NITROGLYCERIN 0.4 MG SL SUBL
0.8000 mg | SUBLINGUAL_TABLET | Freq: Once | SUBLINGUAL | Status: AC
  Administered 2021-03-21: 0.8 mg via SUBLINGUAL

## 2021-03-21 MED ORDER — NITROGLYCERIN 0.4 MG SL SUBL
SUBLINGUAL_TABLET | SUBLINGUAL | Status: AC
  Filled 2021-03-21: qty 2

## 2021-03-21 MED ORDER — IOHEXOL 350 MG/ML SOLN
100.0000 mL | Freq: Once | INTRAVENOUS | Status: AC | PRN
  Administered 2021-03-21: 100 mL via INTRAVENOUS

## 2021-04-18 ENCOUNTER — Other Ambulatory Visit: Payer: Self-pay | Admitting: Internal Medicine

## 2021-04-18 DIAGNOSIS — I10 Essential (primary) hypertension: Secondary | ICD-10-CM

## 2021-05-21 ENCOUNTER — Other Ambulatory Visit: Payer: Self-pay | Admitting: *Deleted

## 2021-05-21 ENCOUNTER — Ambulatory Visit: Payer: BC Managed Care – PPO | Admitting: Internal Medicine

## 2021-05-21 ENCOUNTER — Other Ambulatory Visit: Payer: Self-pay

## 2021-05-21 ENCOUNTER — Encounter: Payer: Self-pay | Admitting: Internal Medicine

## 2021-05-21 VITALS — BP 118/82 | HR 61 | Resp 18 | Ht 69.0 in | Wt 199.1 lb

## 2021-05-21 DIAGNOSIS — I10 Essential (primary) hypertension: Secondary | ICD-10-CM

## 2021-05-21 DIAGNOSIS — G609 Hereditary and idiopathic neuropathy, unspecified: Secondary | ICD-10-CM

## 2021-05-21 DIAGNOSIS — R0789 Other chest pain: Secondary | ICD-10-CM | POA: Diagnosis not present

## 2021-05-21 DIAGNOSIS — E782 Mixed hyperlipidemia: Secondary | ICD-10-CM | POA: Diagnosis not present

## 2021-05-21 DIAGNOSIS — K219 Gastro-esophageal reflux disease without esophagitis: Secondary | ICD-10-CM

## 2021-05-21 DIAGNOSIS — G5603 Carpal tunnel syndrome, bilateral upper limbs: Secondary | ICD-10-CM | POA: Diagnosis not present

## 2021-05-21 DIAGNOSIS — R7303 Prediabetes: Secondary | ICD-10-CM

## 2021-05-21 DIAGNOSIS — E538 Deficiency of other specified B group vitamins: Secondary | ICD-10-CM

## 2021-05-21 MED ORDER — HYDROCHLOROTHIAZIDE 12.5 MG PO CAPS: ORAL_CAPSULE | ORAL | 3 refills | Status: DC

## 2021-05-21 MED ORDER — OMEPRAZOLE 20 MG PO CPDR: DELAYED_RELEASE_CAPSULE | ORAL | 3 refills | Status: DC

## 2021-05-21 MED ORDER — DULOXETINE HCL 30 MG PO CPEP: 30.0000 mg | ORAL_CAPSULE | Freq: Every day | ORAL | 3 refills | Status: DC

## 2021-05-21 NOTE — Progress Notes (Signed)
Established Patient Office Visit  Subjective:  Patient ID: Tara Wolf, female    DOB: 07/29/1965  Age: 56 y.o. MRN: [id number]  CC:  Chief Complaint  Patient presents with   Follow-up    5 month follow up htn pt thinks arthritis is worse sometimes wakes up and arms are numb left side worse than right     HPI Tara Wolf is a 56 y.o. female with past medical history of HTN, GERD, IBS-C and HLD who presents for follow up of her chronic medical conditions.  HTN: BP is well-controlled. Takes medications regularly. Patient denies headache, dizziness, dyspnea or palpitations.  She has seen Cardiologist for episodes of chest pain, which is not frequent now.  Her CT coronary was unremarkable.  She has been taking Crestor for HLD now.  She complains of bilateral hand numbness and arm soreness.  She has started using wrist brace for carpal tunnel syndrome.   Past Medical History:  Diagnosis Date   Arthritis    Chronic constipation    GERD (gastroesophageal reflux disease)    H/O: hysterectomy 07/10/2020   IBS (irritable bowel syndrome)    Nausea and vomiting    chronic   Syncope 05/19/2017    Past Surgical History:  Procedure Laterality Date   ABDOMINAL HYSTERECTOMY     BREAST BIOPSY Left    neg   COLONOSCOPY N/A 06/23/2018   Procedure: COLONOSCOPY;  Surgeon: Danie Binder, MD;  [facility];  Service: Endoscopy;  Laterality: N/A;  1:00pm   COLONOSCOPY WITH ESOPHAGOGASTRODUODENOSCOPY (EGD)  03/2009   Dr. Oneida Alar: Tortuous colon.  Otherwise normal colon.  Moderate sized sliding hiatal hernia.  Multiple erosions in the antrum.  Small bowel biopsies negative for celiac disease.   POLYPECTOMY  06/23/2018   Procedure: POLYPECTOMY;  Surgeon: Danie Binder, MD;  [facility];  Service: Endoscopy;;  transverse colon polyp    Family History  Problem Relation Age of Onset   Breast cancer Cousin    Hypertension Mother    Cancer Father         colon cancer, age 28   Cancer Sister        patient doesn't know what kind   Cancer Brother        not sure primary    Social History   Socioeconomic History   Marital status: Single    Spouse name: Not on file   Number of children: Not on file   Years of education: Not on file   Highest education level: Not on file  Occupational History   Not on file  Tobacco Use   Smoking status: Never   Smokeless tobacco: Never  Vaping Use   Vaping Use: Never used  Substance and Sexual Activity   Alcohol use: Yes    Comment: ocassional   Drug use: No   Sexual activity: Not on file  Other Topics Concern   Not on file  Social History Narrative   Not on file   Social Determinants of Health   Financial Resource Strain: Not on file  Food Insecurity: Not on file  Transportation Needs: Not on file  Physical Activity: Not on file  Stress: Not on file  Social Connections: Not on file  Intimate Partner Violence: Not on file    Outpatient Medications Prior to Visit  Medication Sig Dispense Refill   acetaminophen (TYLENOL) 325 MG tablet Take 650 mg by mouth every 6 (six) hours as needed for mild  pain, fever or headache.     amLODipine (NORVASC) 5 MG tablet TAKE 1 TABLET(5 MG) BY MOUTH DAILY 30 tablet 5   ASPIRIN 81 PO Take by mouth daily.     nitroGLYCERIN (NITROSTAT) 0.4 MG SL tablet Place 1 tablet (0.4 mg total) under the tongue every 5 (five) minutes as needed for chest pain. 90 tablet 3   rosuvastatin (CRESTOR) 5 MG tablet Take 1 tablet (5 mg total) by mouth daily. 90 tablet 1   hydrochlorothiazide (MICROZIDE) 12.5 MG capsule TAKE 1 CAPSULE(12.5 MG) BY MOUTH DAILY 30 capsule 2   omeprazole (PRILOSEC) 20 MG capsule TAKE 1 CAPSULE(20 MG) BY MOUTH DAILY 30 capsule 5   No facility-administered medications prior to visit.    No Known Allergies  ROS Review of Systems  Constitutional:  Positive for fatigue. Negative for chills and fever.  HENT:  Negative for congestion, ear discharge,  sinus pressure, sinus pain and sore throat.   Eyes:  Negative for pain and discharge.  Respiratory:  Negative for cough and shortness of breath.   Cardiovascular:  Positive for leg swelling. Negative for palpitations.  Gastrointestinal:  Positive for constipation. Negative for abdominal pain, diarrhea, nausea and vomiting.  Endocrine: Negative for polydipsia and polyuria.  Genitourinary:  Negative for dysuria and hematuria.  Musculoskeletal:  Positive for myalgias. Negative for neck pain and neck stiffness.  Skin:  Negative for rash.  Neurological:  Positive for numbness. Negative for dizziness and weakness.  Psychiatric/Behavioral:  Positive for sleep disturbance. Negative for agitation and behavioral problems.      Objective:    Physical Exam Vitals reviewed.  Constitutional:      General: She is not in acute distress.    Appearance: She is not diaphoretic.  HENT:     Head: Normocephalic and atraumatic.     Nose: Nose normal.     Mouth/Throat:     Mouth: Mucous membranes are moist.  Eyes:     General: No scleral icterus.    Extraocular Movements: Extraocular movements intact.  Cardiovascular:     Rate and Rhythm: Normal rate and regular rhythm.     Pulses: Normal pulses.     Heart sounds: Normal heart sounds. No murmur heard. Pulmonary:     Breath sounds: Normal breath sounds. No wheezing or rales.  Abdominal:     Palpations: Abdomen is soft.     Tenderness: There is no abdominal tenderness.  Musculoskeletal:     Cervical back: Neck supple. No tenderness.     Right lower leg: Edema (Mild) present.     Left lower leg: Edema (Mild) present.  Skin:    General: Skin is warm.     Findings: No rash.  Neurological:     General: No focal deficit present.     Mental Status: She is alert and oriented to person, place, and time.  Psychiatric:        Mood and Affect: Mood normal.        Behavior: Behavior normal.    BP 118/82 (BP Location: Right Arm, Patient Position:  Sitting, Cuff Size: Normal)    Pulse 61    Resp 18    Ht _0  (1.753 m)    Wt 199 lb 1.3 oz (90.3 kg)    SpO2 95%    BMI 29.40 kg/m  Wt Readings from Last 3 Encounters:  05/21/21 199 lb 1.3 oz (90.3 kg)  03/04/21 197 lb 12.8 oz (89.7 kg)  02/18/21 203 lb 1.9 oz (92.1 kg)  No results found for: TSH Lab Results  Component Value Date   WBC 5.7 02/18/2021   HGB 12.7 02/18/2021   HCT 39.4 02/18/2021   MCV 83 02/18/2021   PLT 258 02/18/2021   Lab Results  Component Value Date   NA 139 03/04/2021   K 3.7 03/04/2021   CO2 26 03/04/2021   GLUCOSE 82 03/04/2021   BUN 15 03/04/2021   CREATININE 0.89 03/04/2021   BILITOT 0.3 02/18/2021   ALKPHOS 74 02/18/2021   AST 16 02/18/2021   ALT 14 02/18/2021   PROT 7.3 02/18/2021   ALBUMIN 4.6 02/18/2021   CALCIUM 9.8 03/04/2021   ANIONGAP 9 06/12/2020   EGFR 77 03/04/2021   Lab Results  Component Value Date   CHOL 214 (H) 02/18/2021   Lab Results  Component Value Date   HDL 47 02/18/2021   Lab Results  Component Value Date   LDLCALC 143 (H) 02/18/2021   Lab Results  Component Value Date   TRIG 132 02/18/2021   Lab Results  Component Value Date   CHOLHDL 4.6 (H) 02/18/2021   Lab Results  Component Value Date   HGBA1C 6.2 (H) 02/18/2021      Assessment & Plan:   Problem List Items Addressed This Visit       Cardiovascular and Mediastinum   HTN (hypertension) - Primary    BP Readings from Last 1 Encounters:  05/21/21 118/82  Well-controlled with Amlodipine 5 mg QD and HCTZ 12.5 mg QD now Counseled for compliance with the medications Advised DASH diet and moderate exercise/walking, at least 150 mins/week       Relevant Orders   Basic Metabolic Panel (BMET)     Digestive   Gastroesophageal reflux disease    Well-controlled with Omeprazole        Nervous and Auditory   Bilateral carpal tunnel syndrome    Has persistent symptoms despite using wrist brace Advised to take Pyridoxine and B12 supplement for  now Added Cymbalta for possible neuropathy      Relevant Medications   DULoxetine (CYMBALTA) 30 MG capsule   Other Relevant Orders   Ambulatory referral to Orthopedic Surgery   TSH + free T4   B12   Idiopathic peripheral neuropathy    B/l arm soreness and numbness of hands Could be neuropathy vs fibromyalgia Started Cymbalta, increase dose if tolerated      Relevant Medications   DULoxetine (CYMBALTA) 30 MG capsule     Other   Hyperlipidemia    On Crestor now Check lipid profile      Relevant Orders   Lipid Profile   Atypical chest pain    Was seen by cardiology CT coronary was unremarkable      Prediabetes   Relevant Orders   Basic Metabolic Panel (BMET)   Hemoglobin A1c   Other Visit Diagnoses     Vitamin B12 deficiency       Relevant Orders   B12       Meds ordered this encounter  Medications   DULoxetine (CYMBALTA) 30 MG capsule    Sig: Take 1 capsule (30 mg total) by mouth daily.    Dispense:  30 capsule    Refill:  3    Follow-up: Return in about 4 months (around 09/18/2021) for HTN and peripheral neuropathy.    Lindell Spar, MD

## 2021-05-21 NOTE — Assessment & Plan Note (Signed)
BP Readings from Last 1 Encounters:  05/21/21 118/82   Well-controlled with Amlodipine 5 mg QD and HCTZ 12.5 mg QD now Counseled for compliance with the medications Advised DASH diet and moderate exercise/walking, at least 150 mins/week

## 2021-05-21 NOTE — Patient Instructions (Addendum)
Please start taking Cymbalta as prescribed.  Please start taking Pyridoxine 500 mg and Vitamin B12 1000 mcg once daily.  Please get fasting blood tests done before the next visit.

## 2021-05-21 NOTE — Assessment & Plan Note (Signed)
On Crestor now Check lipid profile 

## 2021-05-21 NOTE — Assessment & Plan Note (Signed)
Well-controlled with Omeprazole 

## 2021-05-21 NOTE — Assessment & Plan Note (Signed)
Has persistent symptoms despite using wrist brace Advised to take Pyridoxine and B12 supplement for now Added Cymbalta for possible neuropathy

## 2021-05-21 NOTE — Assessment & Plan Note (Signed)
B/l arm soreness and numbness of hands Could be neuropathy vs fibromyalgia Started Cymbalta, increase dose if tolerated

## 2021-05-21 NOTE — Assessment & Plan Note (Signed)
Was seen by cardiology CT coronary was unremarkable

## 2021-05-22 ENCOUNTER — Other Ambulatory Visit: Payer: Self-pay | Admitting: *Deleted

## 2021-05-22 ENCOUNTER — Telehealth: Payer: Self-pay

## 2021-05-22 DIAGNOSIS — G609 Hereditary and idiopathic neuropathy, unspecified: Secondary | ICD-10-CM

## 2021-05-22 DIAGNOSIS — I10 Essential (primary) hypertension: Secondary | ICD-10-CM

## 2021-05-22 DIAGNOSIS — K219 Gastro-esophageal reflux disease without esophagitis: Secondary | ICD-10-CM

## 2021-05-22 MED ORDER — DULOXETINE HCL 30 MG PO CPEP: 30.0000 mg | ORAL_CAPSULE | Freq: Every day | ORAL | 3 refills | Status: DC

## 2021-05-22 MED ORDER — HYDROCHLOROTHIAZIDE 12.5 MG PO CAPS: ORAL_CAPSULE | ORAL | 3 refills | Status: DC

## 2021-05-22 MED ORDER — OMEPRAZOLE 20 MG PO CPDR: DELAYED_RELEASE_CAPSULE | ORAL | 3 refills | Status: DC

## 2021-05-22 NOTE — Telephone Encounter (Signed)
Medications sent to [facility]

## 2021-05-22 NOTE — Telephone Encounter (Signed)
Patient called all her medicines was called into the wrong pharmacy.  Patient usees [facility] in [place], please resend to [facility].   DULoxetine (CYMBALTA) 30 MG capsule  hydrochlorothiazide (MICROZIDE) 12.5 MG capsule    omeprazole (PRILOSEC) 20 MG capsule

## 2021-05-27 ENCOUNTER — Other Ambulatory Visit: Payer: Self-pay

## 2021-05-27 ENCOUNTER — Ambulatory Visit (INDEPENDENT_AMBULATORY_CARE_PROVIDER_SITE_OTHER): Payer: BC Managed Care – PPO | Admitting: Orthopaedic Surgery

## 2021-05-27 ENCOUNTER — Encounter: Payer: Self-pay | Admitting: Orthopaedic Surgery

## 2021-05-27 VITALS — BP 171/82 | HR 78 | Ht 69.0 in | Wt 190.0 lb

## 2021-05-27 DIAGNOSIS — R202 Paresthesia of skin: Secondary | ICD-10-CM

## 2021-05-27 DIAGNOSIS — G5602 Carpal tunnel syndrome, left upper limb: Secondary | ICD-10-CM | POA: Diagnosis not present

## 2021-05-27 DIAGNOSIS — M79641 Pain in right hand: Secondary | ICD-10-CM

## 2021-05-27 DIAGNOSIS — G5603 Carpal tunnel syndrome, bilateral upper limbs: Secondary | ICD-10-CM | POA: Diagnosis not present

## 2021-05-27 DIAGNOSIS — M79642 Pain in left hand: Secondary | ICD-10-CM

## 2021-05-27 MED ORDER — PREDNISONE 5 MG (21) PO TBPK: ORAL_TABLET | ORAL | 0 refills | Status: DC

## 2021-05-27 NOTE — Patient Instructions (Signed)
Driving Directions to [employer] from [employer] address is [place] The phone number is [phone]  Dr Ernestina Patches will do the nerve study for you, they will call you to schedule  1. Start out going [place] on [address] toward [address].  Then 0.02 miles0.02 total miles 2. Take the 1st right onto [address]/[place]. Continue to follow [address].  If you reach [facility] you've gone a little too far  Then 0.58 miles0.60 total miles 3. Turn right onto [place] is just past [facility]  Then 2.25 miles2.85 total miles 4. Take the [address] ramp toward [place].  Then 0.25 miles3.10 total miles 5. Merge onto [address]  Then 18.17 miles21.28 total miles 6. Merge onto [address].  Then 1.47 miles22.74 total miles 7. Turn right onto [address] is just past [place]  Then 0.11 miles22.85 total miles  8. [address], [place], [place] [address], [place] is on the left.

## 2021-05-27 NOTE — Progress Notes (Signed)
Subjective:    Patient ID: Tara Wolf, female    DOB: 12-16-1965, 56 y.o.   MRN: [id number]  HPI She has had pain in both hands, more at night and numbness.  It is getting worse over the last three to six months.  She has seen Dr. Posey Pronto for this and I have reviewed his notes.  She is not getting any better.  She has to shake her hands to "awaken" them.  She has no trauma, no redness.  Nothing seems to help, Tylenol, rubs, etc.   Review of Systems  Constitutional:  Positive for activity change.  Musculoskeletal:  Positive for arthralgias and myalgias.  All other systems reviewed and are negative. For Review of Systems, all other systems reviewed and are negative.  The following is a summary of the past history medically, past history surgically, known current medicines, social history and family history.  This information is gathered electronically by the computer from prior information and documentation.  I review this each visit and have found including this information at this point in the chart is beneficial and informative.   Past Medical History:  Diagnosis Date   Arthritis    Chronic constipation    GERD (gastroesophageal reflux disease)    H/O: hysterectomy 07/10/2020   IBS (irritable bowel syndrome)    Nausea and vomiting    chronic   Syncope 05/19/2017    Past Surgical History:  Procedure Laterality Date   ABDOMINAL HYSTERECTOMY     BREAST BIOPSY Left    neg   COLONOSCOPY N/A 06/23/2018   Procedure: COLONOSCOPY;  Surgeon: Danie Binder, MD;  Location: [facility];  Service: Endoscopy;  Laterality: N/A;  1:00pm   COLONOSCOPY WITH ESOPHAGOGASTRODUODENOSCOPY (EGD)  03/2009   Dr. Oneida Alar: Tortuous colon.  Otherwise normal colon.  Moderate sized sliding hiatal hernia.  Multiple erosions in the antrum.  Small bowel biopsies negative for celiac disease.   POLYPECTOMY  06/23/2018   Procedure: POLYPECTOMY;  Surgeon: Danie Binder, MD;  Location: [facility];   Service: Endoscopy;;  transverse colon polyp    Current Outpatient Medications on File Prior to Visit  Medication Sig Dispense Refill   acetaminophen (TYLENOL) 325 MG tablet Take 650 mg by mouth every 6 (six) hours as needed for mild pain, fever or headache.     amLODipine (NORVASC) 5 MG tablet TAKE 1 TABLET(5 MG) BY MOUTH DAILY 30 tablet 5   ASPIRIN 81 PO Take by mouth daily.     DULoxetine (CYMBALTA) 30 MG capsule Take 1 capsule (30 mg total) by mouth daily. 30 capsule 3   hydrochlorothiazide (MICROZIDE) 12.5 MG capsule TAKE 1 CAPSULE(12.5 MG) BY MOUTH DAILY 90 capsule 3   nitroGLYCERIN (NITROSTAT) 0.4 MG SL tablet Place 1 tablet (0.4 mg total) under the tongue every 5 (five) minutes as needed for chest pain. 90 tablet 3   omeprazole (PRILOSEC) 20 MG capsule TAKE 1 CAPSULE(20 MG) BY MOUTH DAILY 90 capsule 3   rosuvastatin (CRESTOR) 5 MG tablet Take 1 tablet (5 mg total) by mouth daily. 90 tablet 1   No current facility-administered medications on file prior to visit.    Social History   Socioeconomic History   Marital status: Single    Spouse name: Not on file   Number of children: Not on file   Years of education: Not on file   Highest education level: Not on file  Occupational History   Not on file  Tobacco Use  Smoking status: Never   Smokeless tobacco: Never  Vaping Use   Vaping Use: Never used  Substance and Sexual Activity   Alcohol use: Yes    Comment: ocassional   Drug use: No   Sexual activity: Not on file  Other Topics Concern   Not on file  Social History Narrative   Not on file   Social Determinants of Health   Financial Resource Strain: Not on file  Food Insecurity: Not on file  Transportation Needs: Not on file  Physical Activity: Not on file  Stress: Not on file  Social Connections: Not on file  Intimate Partner Violence: Not on file    Family History  Problem Relation Age of Onset   Breast cancer Cousin    Hypertension Mother    Cancer  Father        colon cancer, age 43   Cancer Sister        patient doesn't know what kind   Cancer Brother        not sure primary    BP (!) 171/82    Pulse 78    Ht 5\' 9"  (1.753 m)    Wt 190 lb (86.2 kg)    BMI 28.06 kg/m   Body mass index is 28.06 kg/m.     Objective:   Physical Exam Vitals and nursing note reviewed. Exam conducted with a chaperone present.  Constitutional:      Appearance: She is well-developed.  HENT:     Head: Normocephalic and atraumatic.  Eyes:     Conjunctiva/sclera: Conjunctivae normal.     Pupils: Pupils are equal, round, and reactive to light.  Cardiovascular:     Rate and Rhythm: Normal rate and regular rhythm.  Pulmonary:     Effort: Pulmonary effort is normal.  Abdominal:     Palpations: Abdomen is soft.  Musculoskeletal:       Hands:     Cervical back: Normal range of motion and neck supple.  Skin:    General: Skin is warm and dry.  Neurological:     Mental Status: She is alert and oriented to person, place, and time.     Cranial Nerves: No cranial nerve deficit.     Motor: No abnormal muscle tone.     Coordination: Coordination normal.     Deep Tendon Reflexes: Reflexes are normal and symmetric. Reflexes normal.  Psychiatric:        Behavior: Behavior normal.        Thought Content: Thought content normal.        Judgment: Judgment normal.          Assessment & Plan:   Encounter Diagnoses  Name Primary?   Paresthesia of hand, bilateral Yes   Bilateral hand pain    Carpal tunnel syndrome on both sides    I will get EMG's scheduled.  I will have her use a cock-up splint left.  I have told her about carpal tunnel and probable surgery.  Return in three weeks.  Call if any problem.  Precautions discussed.  Electronically Signed Sanjuana Kava, MD 2/14/202310:37 AM

## 2021-06-09 ENCOUNTER — Ambulatory Visit: Payer: BC Managed Care – PPO | Admitting: Orthopedic Surgery

## 2021-06-11 ENCOUNTER — Ambulatory Visit (INDEPENDENT_AMBULATORY_CARE_PROVIDER_SITE_OTHER): Payer: BC Managed Care – PPO | Admitting: Physical Medicine and Rehabilitation

## 2021-06-11 ENCOUNTER — Encounter: Payer: Self-pay | Admitting: Physical Medicine and Rehabilitation

## 2021-06-11 ENCOUNTER — Other Ambulatory Visit: Payer: Self-pay

## 2021-06-11 DIAGNOSIS — R202 Paresthesia of skin: Secondary | ICD-10-CM

## 2021-06-11 NOTE — Progress Notes (Signed)
Pt state pain and numbness in both hands and arms. Pt state all her finger feels numb. 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 Pt state she is right handed. ? ?Numeric Pain Rating Scale and Functional Assessment ?Average Pain 2 ? ? ?In the last MONTH (on 0-10 scale) has pain interfered with the following? ? ?1. General activity like being  able to carry out your everyday physical activities such as walking, climbing stairs, carrying groceries, or moving a chair?  ?Rating(10) ? ? ? ? ?

## 2021-06-16 NOTE — Progress Notes (Signed)
? ?TERYN Wolf - 56 y.o. female MRN [id number]  Date of birth: [birth year] ? ?Office Visit Note: ?Visit Date: 06/11/2021 ?PCP: Lindell Spar, MD ?Referred by: Sanjuana Kava, MD ? ?Subjective: ?Chief Complaint  ?Patient presents with  ? Right Hand - Pain, Numbness  ? Left Hand - Pain, Numbness  ? Right Arm - Pain, Numbness  ? Left Arm - Pain, Numbness  ? ?HPI:  Tara Wolf is a 56 y.o. female who comes in today at the request of Dr. Sanjuana Kava for electrodiagnostic study of the Bilateral upper extremities.  Patient is Right hand dominant.  She reports chronic worsening severe at times Bilateral hand pain with numbness and tingling in both hands and arms in a nondermatomal fashion and all of her fingers.  She reports moving packages seem to hurt her hands the most and give her discomfort.  She has more pain than numbness.  She does have a history of diagnosed idiopathic peripheral neuropathy.  Last hemoglobin A1c was 6.1.  No frank radicular symptoms.  No prior electrodiagnostic studies. ? ?ROS Otherwise per HPI. ? ?Assessment & Plan: ?Visit Diagnoses:  ?  ICD-10-CM   ?1. Paresthesia of skin  R20.2 NCV with EMG (electromyography)  ?  ?  ?Plan: Impression: ?Essentially NORMAL electrodiagnostic study of both upper limbs.  There is no significant electrodiagnostic evidence of nerve entrapment, brachial plexopathy or cervical radiculopathy.   ? ?As you know, purely sensory or demyelinating radiculopathies and chemical radiculitis may not be detected with this particular electrodiagnostic study. **This electrodiagnostic study cannot rule out small fiber polyneuropathy and dysesthesias from central pain syndromes such as stroke or central pain sensitization syndromes such as fibromyalgia.  Myotomal referral pain from trigger points is also not excluded. ? ?Recommendations: ?1.  Follow-up with referring physician. ?2.  Continue current management of symptoms. ? ?Meds & Orders: No orders of the defined  types were placed in this encounter. ?  ?Orders Placed This Encounter  ?Procedures  ? NCV with EMG (electromyography)  ?  ?Follow-up: Return in about 2 weeks (around 06/25/2021) for Sanjuana Kava, MD.  ? ?Procedures: ?No procedures performed  ?EMG & NCV Findings: ?All nerve conduction studies (as indicated in the following tables) were within normal limits.  All left vs. right side differences were within normal limits.   ? ?All examined muscles (as indicated in the following table) showed no evidence of electrical instability.   ? ?Impression: ?Essentially NORMAL electrodiagnostic study of both upper limbs.  There is no significant electrodiagnostic evidence of nerve entrapment, brachial plexopathy or cervical radiculopathy.   ? ?As you know, purely sensory or demyelinating radiculopathies and chemical radiculitis may not be detected with this particular electrodiagnostic study. **This electrodiagnostic study cannot rule out small fiber polyneuropathy and dysesthesias from central pain syndromes such as stroke or central pain sensitization syndromes such as fibromyalgia.  Myotomal referral pain from trigger points is also not excluded. ? ?Recommendations: ?1.  Follow-up with referring physician. ?2.  Continue current management of symptoms. ? ?___________________________ ?Laurence Spates FAAPMR ?Board Certified, Tax adviser of Physical Medicine and Rehabilitation ? ? ? ?Nerve Conduction Studies ?Anti Sensory Summary Table ? ? Stim Site NR Peak (ms) Norm Peak (ms) P-T Amp (?V) Norm P-T Amp Site1 Site2 Delta-P (ms) Dist (cm) Vel (m/s) Norm Vel (m/s)  ?Left Median Acr Palm Anti Sensory (2nd Digit)  32.7?C  ?Wrist    3.5 <3.6 28.0 >10 Wrist Palm 1.7 0.0    ?Palm    1.8 <  2.0 8.6         ?Right Median Acr Palm Anti Sensory (2nd Digit)  31.8?C  ?Wrist    3.5 <3.6 25.7 >10 Wrist Palm 1.8 0.0    ?Palm    1.7 <2.0 17.4         ?Left Radial Anti Sensory (Base 1st Digit)  32.3?C  ?Wrist    2.1 <3.1 34.4  Wrist Base 1st Digit 2.1  0.0    ?Right Radial Anti Sensory (Base 1st Digit)  32.4?C  ?Wrist    2.3 <3.1 22.5  Wrist Base 1st Digit 2.3 0.0    ?Left Ulnar Anti Sensory (5th Digit)  32.9?C  ?Wrist    3.0 <3.7 18.1 >15.0 Wrist 5th Digit 3.0 14.0 47 >38  ?Right Ulnar Anti Sensory (5th Digit)  32.3?C  ?Wrist    3.4 <3.7 22.0 >15.0 Wrist 5th Digit 3.4 14.0 41 >38  ? ?Motor Summary Table ? ? Stim Site NR Onset (ms) Norm Onset (ms) O-P Amp (mV) Norm O-P Amp Site1 Site2 Delta-0 (ms) Dist (cm) Vel (m/s) Norm Vel (m/s)  ?Left Median Motor (Abd Poll Brev)  32.5?C  ?Wrist    3.0 <4.2 7.4 >5 Elbow Wrist 4.7 24.5 52 >50  ?Elbow    7.7  6.8         ?Right Median Motor (Abd Poll Brev)  32.5?C  ?Wrist    3.2 <4.2 6.4 >5 Elbow Wrist 4.6 24.0 52 >50  ?Elbow    7.8  5.0         ?Left Ulnar Motor (Abd Dig Min)  32.4?C  ?Wrist    2.3 <4.2 9.1 >3 B Elbow Wrist 4.0 24.0 60 >53  ?B Elbow    6.3  7.9  A Elbow B Elbow 1.3 10.0 77 >53  ?A Elbow    7.6  7.3         ?Right Ulnar Motor (Abd Dig Min)  32.4?C  ?Wrist    2.6 <4.2 8.6 >3 B Elbow Wrist 3.9 23.0 59 >53  ?B Elbow    6.5  7.6  A Elbow B Elbow 1.1 10.0 91 >53  ?A Elbow    7.6  7.2         ? ?EMG ? ? Side Muscle Nerve Root Ins Act Fibs Psw Amp Dur Poly Recrt Int Fraser Din Comment  ?Left Abd Poll Brev Median C8-T1 Nml Nml Nml Nml Nml 0 Nml Nml   ?Left 1stDorInt Ulnar C8-T1 Nml Nml Nml Nml Nml 0 Nml Nml   ?Left PronatorTeres Median C6-7 Nml Nml Nml Nml Nml 0 Nml Nml   ?Left Biceps Musculocut C5-6 Nml Nml Nml Nml Nml 0 Nml Nml   ?Left Deltoid Axillary C5-6 Nml Nml Nml Nml Nml 0 Nml Nml   ? ? ?Nerve Conduction Studies ?Anti Sensory Left/Right Comparison ? ? Stim Site L Lat (ms) R Lat (ms) L-R Lat (ms) L Amp (?V) R Amp (?V) L-R Amp (%) Site1 Site2 L Vel (m/s) R Vel (m/s) L-R Vel (m/s)  ?Median Acr Palm Anti Sensory (2nd Digit)  32.7?C  ?Wrist 3.5 3.5 0.0 28.0 25.7 8.2 Wrist Palm     ?Palm 1.8 1.7 0.1 8.6 17.4 50.6       ?Radial Anti Sensory (Base 1st Digit)  32.3?C  ?Wrist 2.1 2.3 0.2 34.4 22.5 34.6 Wrist Base 1st Digit      ?Ulnar Anti Sensory (5th Digit)  32.9?C  ?Wrist 3.0 3.4 0.4 18.1 22.0 17.7 Wrist 5th Digit 47 41  6  ? ?Motor Left/Right Comparison ? ? Stim Site L Lat (ms) R Lat (ms) L-R Lat (ms) L Amp (mV) R Amp (mV) L-R Amp (%) Site1 Site2 L Vel (m/s) R Vel (m/s) L-R Vel (m/s)  ?Median Motor (Abd Poll Brev)  32.5?C  ?Wrist 3.0 3.2 0.2 7.4 6.4 13.5 Elbow Wrist 52 52 0  ?Elbow 7.7 7.8 0.1 6.8 5.0 26.5       ?Ulnar Motor (Abd Dig Min)  32.4?C  ?Wrist 2.3 2.6 0.3 9.1 8.6 5.5 B Elbow Wrist 60 59 1  ?B Elbow 6.3 6.5 0.2 7.9 7.6 3.8 A Elbow B Elbow 77 91 14  ?A Elbow 7.6 7.6 0.0 7.3 7.2 1.4       ? ? ? ?Waveforms: ?    ? ?    ? ?    ? ?  ? ?  ? ?Clinical History: ?No specialty comments available.  ? ? ? ?Objective:  VS:  HT:    WT:   BMI:     BP:   HR: bpm  TEMP: ( )  RESP:  ?Physical Exam ?Musculoskeletal:     ?   General: No swelling, tenderness or deformity.  ?   Comments: Inspection reveals no atrophy of the bilateral APB or FDI or hand intrinsics. There is no swelling, color changes, allodynia or dystrophic changes. There is 5 out of 5 strength in the bilateral wrist extension, finger abduction and long finger flexion. There is intact sensation to light touch in all dermatomal and peripheral nerve distributions. There is a negative Phalen's test bilaterally. There is a negative Hoffmann's test bilaterally.  ?Skin: ?   General: Skin is warm and dry.  ?   Findings: No erythema or rash.  ?Neurological:  ?   General: No focal deficit present.  ?   Mental Status: She is alert and oriented to person, place, and time.  ?   Motor: No weakness or abnormal muscle tone.  ?   Coordination: Coordination normal.  ?Psychiatric:     ?   Mood and Affect: Mood normal.     ?   Behavior: Behavior normal.  ?  ? ?Imaging: ?No results found. ?

## 2021-06-16 NOTE — Procedures (Signed)
EMG & NCV Findings: ?All nerve conduction studies (as indicated in the following tables) were within normal limits.  All left vs. right side differences were within normal limits.   ? ?All examined muscles (as indicated in the following table) showed no evidence of electrical instability.   ? ?Impression: ?Essentially NORMAL electrodiagnostic study of both upper limbs.  There is no significant electrodiagnostic evidence of nerve entrapment, brachial plexopathy or cervical radiculopathy.   ? ?As you know, purely sensory or demyelinating radiculopathies and chemical radiculitis may not be detected with this particular electrodiagnostic study. **This electrodiagnostic study cannot rule out small fiber polyneuropathy and dysesthesias from central pain syndromes such as stroke or central pain sensitization syndromes such as fibromyalgia.  Myotomal referral pain from trigger points is also not excluded. ? ?Recommendations: ?1.  Follow-up with referring physician. ?2.  Continue current management of symptoms. ? ?___________________________ ?Laurence Spates FAAPMR ?Board Certified, Tax adviser of Physical Medicine and Rehabilitation ? ? ? ?Nerve Conduction Studies ?Anti Sensory Summary Table ? ? Stim Site NR Peak (ms) Norm Peak (ms) P-T Amp (?V) Norm P-T Amp Site1 Site2 Delta-P (ms) Dist (cm) Vel (m/s) Norm Vel (m/s)  ?Left Median Acr Palm Anti Sensory (2nd Digit)  32.7?C  ?Wrist    3.5 <3.6 28.0 >10 Wrist Palm 1.7 0.0    ?Palm    1.8 <2.0 8.6         ?Right Median Acr Palm Anti Sensory (2nd Digit)  31.8?C  ?Wrist    3.5 <3.6 25.7 >10 Wrist Palm 1.8 0.0    ?Palm    1.7 <2.0 17.4         ?Left Radial Anti Sensory (Base 1st Digit)  32.3?C  ?Wrist    2.1 <3.1 34.4  Wrist Base 1st Digit 2.1 0.0    ?Right Radial Anti Sensory (Base 1st Digit)  32.4?C  ?Wrist    2.3 <3.1 22.5  Wrist Base 1st Digit 2.3 0.0    ?Left Ulnar Anti Sensory (5th Digit)  32.9?C  ?Wrist    3.0 <3.7 18.1 >15.0 Wrist 5th Digit 3.0 14.0 47 >38  ?Right Ulnar  Anti Sensory (5th Digit)  32.3?C  ?Wrist    3.4 <3.7 22.0 >15.0 Wrist 5th Digit 3.4 14.0 41 >38  ? ?Motor Summary Table ? ? Stim Site NR Onset (ms) Norm Onset (ms) O-P Amp (mV) Norm O-P Amp Site1 Site2 Delta-0 (ms) Dist (cm) Vel (m/s) Norm Vel (m/s)  ?Left Median Motor (Abd Poll Brev)  32.5?C  ?Wrist    3.0 <4.2 7.4 >5 Elbow Wrist 4.7 24.5 52 >50  ?Elbow    7.7  6.8         ?Right Median Motor (Abd Poll Brev)  32.5?C  ?Wrist    3.2 <4.2 6.4 >5 Elbow Wrist 4.6 24.0 52 >50  ?Elbow    7.8  5.0         ?Left Ulnar Motor (Abd Dig Min)  32.4?C  ?Wrist    2.3 <4.2 9.1 >3 B Elbow Wrist 4.0 24.0 60 >53  ?B Elbow    6.3  7.9  A Elbow B Elbow 1.3 10.0 77 >53  ?A Elbow    7.6  7.3         ?Right Ulnar Motor (Abd Dig Min)  32.4?C  ?Wrist    2.6 <4.2 8.6 >3 B Elbow Wrist 3.9 23.0 59 >53  ?B Elbow    6.5  7.6  A Elbow B Elbow 1.1 10.0 91 >53  ?  A Elbow    7.6  7.2         ? ?EMG ? ? Side Muscle Nerve Root Ins Act Fibs Psw Amp Dur Poly Recrt Int Fraser Din Comment  ?Left Abd Poll Brev Median C8-T1 Nml Nml Nml Nml Nml 0 Nml Nml   ?Left 1stDorInt Ulnar C8-T1 Nml Nml Nml Nml Nml 0 Nml Nml   ?Left PronatorTeres Median C6-7 Nml Nml Nml Nml Nml 0 Nml Nml   ?Left Biceps Musculocut C5-6 Nml Nml Nml Nml Nml 0 Nml Nml   ?Left Deltoid Axillary C5-6 Nml Nml Nml Nml Nml 0 Nml Nml   ? ? ?Nerve Conduction Studies ?Anti Sensory Left/Right Comparison ? ? Stim Site L Lat (ms) R Lat (ms) L-R Lat (ms) L Amp (?V) R Amp (?V) L-R Amp (%) Site1 Site2 L Vel (m/s) R Vel (m/s) L-R Vel (m/s)  ?Median Acr Palm Anti Sensory (2nd Digit)  32.7?C  ?Wrist 3.5 3.5 0.0 28.0 25.7 8.2 Wrist Palm     ?Palm 1.8 1.7 0.1 8.6 17.4 50.6       ?Radial Anti Sensory (Base 1st Digit)  32.3?C  ?Wrist 2.1 2.3 0.2 34.4 22.5 34.6 Wrist Base 1st Digit     ?Ulnar Anti Sensory (5th Digit)  32.9?C  ?Wrist 3.0 3.4 0.4 18.1 22.0 17.7 Wrist 5th Digit 47 41 6  ? ?Motor Left/Right Comparison ? ? Stim Site L Lat (ms) R Lat (ms) L-R Lat (ms) L Amp (mV) R Amp (mV) L-R Amp (%) Site1 Site2 L Vel (m/s) R  Vel (m/s) L-R Vel (m/s)  ?Median Motor (Abd Poll Brev)  32.5?C  ?Wrist 3.0 3.2 0.2 7.4 6.4 13.5 Elbow Wrist 52 52 0  ?Elbow 7.7 7.8 0.1 6.8 5.0 26.5       ?Ulnar Motor (Abd Dig Min)  32.4?C  ?Wrist 2.3 2.6 0.3 9.1 8.6 5.5 B Elbow Wrist 60 59 1  ?B Elbow 6.3 6.5 0.2 7.9 7.6 3.8 A Elbow B Elbow 77 91 14  ?A Elbow 7.6 7.6 0.0 7.3 7.2 1.4       ? ? ? ?Waveforms: ?    ? ?    ? ?    ? ?  ? ? ?

## 2021-06-17 ENCOUNTER — Encounter: Payer: Self-pay | Admitting: Orthopaedic Surgery

## 2021-06-17 ENCOUNTER — Other Ambulatory Visit: Payer: Self-pay

## 2021-06-17 ENCOUNTER — Ambulatory Visit (INDEPENDENT_AMBULATORY_CARE_PROVIDER_SITE_OTHER): Payer: BC Managed Care – PPO | Admitting: Orthopaedic Surgery

## 2021-06-17 DIAGNOSIS — G8929 Other chronic pain: Secondary | ICD-10-CM

## 2021-06-17 DIAGNOSIS — M25512 Pain in left shoulder: Secondary | ICD-10-CM | POA: Diagnosis not present

## 2021-06-17 DIAGNOSIS — M79641 Pain in right hand: Secondary | ICD-10-CM | POA: Diagnosis not present

## 2021-06-17 MED ORDER — DICLOFENAC SODIUM 75 MG PO TBEC: 75.0000 mg | DELAYED_RELEASE_TABLET | Freq: Two times a day (BID) | ORAL | 2 refills | Status: DC

## 2021-06-17 NOTE — Progress Notes (Signed)
My left shoulder hurts. ? ?She saw Dr. Ernestina Patches for EMGs.  They were normal.  I have reviewed his report. ? ?She has left shoulder pain now.  She has some numbness of the right hand but less than before.  The left shoulder has no swelling, no redness, no numbness, no trauma. ? ?Left shoulder has full ROM.  NV intact. ? ?Right hand has some tenderness but no swelling.  NV intact. ? ?Encounter Diagnoses  ?Name Primary?  ? Chronic left shoulder pain Yes  ? Right hand pain   ? ?I will change to diclofenac. ? ?Use Biofreeze. ? ?Return in three weeks. ? ?Call if any problem. ? ?Precautions discussed. ? ?Electronically Signed ?Sanjuana Kava, MD ?3/7/20239:10 AM ? ?

## 2021-07-08 ENCOUNTER — Encounter: Payer: Self-pay | Admitting: Orthopaedic Surgery

## 2021-07-08 ENCOUNTER — Other Ambulatory Visit: Payer: Self-pay

## 2021-07-08 ENCOUNTER — Ambulatory Visit (INDEPENDENT_AMBULATORY_CARE_PROVIDER_SITE_OTHER): Payer: BC Managed Care – PPO | Admitting: Orthopaedic Surgery

## 2021-07-08 VITALS — BP 152/99 | HR 72 | Ht 69.0 in | Wt 200.0 lb

## 2021-07-08 DIAGNOSIS — M79641 Pain in right hand: Secondary | ICD-10-CM

## 2021-07-08 DIAGNOSIS — R202 Paresthesia of skin: Secondary | ICD-10-CM

## 2021-07-08 DIAGNOSIS — M79642 Pain in left hand: Secondary | ICD-10-CM

## 2021-07-08 NOTE — Progress Notes (Signed)
My hands still go numb ? ?She has bilateral hand pain.  She changed to diclofenac which helps but she still has numbness.  She has no new trauma.  Prior EMG was negative for carpal tunnel.  She has no redness, no swelling. ? ?Hands are bilaterally equal with no swelling, no redness. She has slight decreased sensation of the tips of the index and long more on the right. ? ?Encounter Diagnoses  ?Name Primary?  ? Right hand pain Yes  ? Paresthesia of hand, bilateral   ? Bilateral hand pain   ? ?I will have her continue the diclofenac. ? ?I will begin OT. ? ?Return in three weeks. ? ?She may need to see hand surgeon. ? ?Call if any problem. ? ?Precautions discussed. ? ?Electronically Signed ?Sanjuana Kava, MD ?3/28/20239:19 AM ? ?

## 2021-07-29 ENCOUNTER — Ambulatory Visit (INDEPENDENT_AMBULATORY_CARE_PROVIDER_SITE_OTHER): Payer: BC Managed Care – PPO | Admitting: Orthopaedic Surgery

## 2021-07-29 ENCOUNTER — Encounter: Payer: Self-pay | Admitting: Orthopaedic Surgery

## 2021-07-29 VITALS — BP 131/72 | HR 71 | Ht 65.0 in | Wt 199.4 lb

## 2021-07-29 DIAGNOSIS — M79641 Pain in right hand: Secondary | ICD-10-CM | POA: Diagnosis not present

## 2021-07-29 NOTE — Addendum Note (Signed)
Addended by: Brand Males E on: 07/29/2021 08:26 AM ? ? Modules accepted: Orders ? ?

## 2021-07-29 NOTE — Progress Notes (Signed)
My hand still hurts. ? ?She has continued pain of the right hand and wrist with numbness.  She has no redness, no swelling, no new trauma. ? ?I had set her up to have OT to the hand about three weeks ago.  She says the appointment was made for May 3rd.  I know they have had staff changes there but that is a long time to wait.  We checked on it and they had tried to contact her but were unable. ? ?I do not know why she has the numbness and pain.  Her EMGs were negative.  I will have hand surgeon see her.  She is agreeable to this. ? ?Right hand has full ROM, full ROM of fingers and wrist.  She complains of numbness in median nerve distribution.  She has no redness, no swelling. ? ?Encounter Diagnosis  ?Name Primary?  ? Right hand pain Yes  ? ?To see hand surgeon. ? ?Call if any problem. ? ?Precautions discussed. ? ?Electronically Signed ?Sanjuana Kava, MD ?4/18/20238:24 AM ? ?

## 2021-07-30 ENCOUNTER — Other Ambulatory Visit: Payer: Self-pay | Admitting: Internal Medicine

## 2021-07-30 DIAGNOSIS — E782 Mixed hyperlipidemia: Secondary | ICD-10-CM

## 2021-08-04 ENCOUNTER — Ambulatory Visit (INDEPENDENT_AMBULATORY_CARE_PROVIDER_SITE_OTHER): Payer: BC Managed Care – PPO | Admitting: Orthopedic Surgery

## 2021-08-04 ENCOUNTER — Encounter: Payer: Self-pay | Admitting: Orthopedic Surgery

## 2021-08-04 DIAGNOSIS — R200 Anesthesia of skin: Secondary | ICD-10-CM | POA: Diagnosis not present

## 2021-08-04 MED ORDER — BUPIVACAINE HCL 0.5 % IJ SOLN
0.5000 mL | INTRAMUSCULAR | Status: AC | PRN
  Administered 2021-08-04: .5 mL

## 2021-08-04 MED ORDER — BETAMETHASONE SOD PHOS & ACET 6 (3-3) MG/ML IJ SUSP
6.0000 mg | INTRAMUSCULAR | Status: AC | PRN
  Administered 2021-08-04: 6 mg via INTRA_ARTICULAR

## 2021-08-04 NOTE — Progress Notes (Signed)
? ?Office Visit Note ?  ?Patient: Tara Wolf           ?Date of Birth: 1966-03-16           ?MRN: [id number] ?Visit Date: 08/04/2021 ?             ?Requested by: Sanjuana Kava, MD ?[facility] ?[place],  [place] [address] ?PCP: Lindell Spar, MD ? ? ?Assessment & Plan: ?Visit Diagnoses:  ?1. Bilateral hand numbness   ? ? ?Plan: Discussed with patient that she does have some history and exam findings that could be consistent with carpal tunnel syndrome despite the negative electrodiagnostic studies.  She is a very difficult time describing her symptoms.  We discussed the nature of carpal tunnel syndrome as well as its diagnosis, prognosis and both conservative and surgical treatment options.  Given the unclear clinical picture, I have recommended a corticosteroid injection into the right carpal tunnel for both diagnostic and potentially therapeutic purposes.  We discussed the risks of carpal tunnel injection.  I will see her back in the office in 4 to 6 weeks to see if she had any response. ? ?Follow-Up Instructions: No follow-ups on file.  ? ?Orders:  ?No orders of the defined types were placed in this encounter. ? ?No orders of the defined types were placed in this encounter. ? ? ? ? Procedures: ?Hand/UE Inj: R carpal tunnel for carpal tunnel syndrome on 08/04/2021 9:28 AM ?Indications: diagnostic and therapeutic ?Details: 25 G needle, volar approach ?Medications: 0.5 mL bupivacaine 0.5 %; 6 mg betamethasone acetate-betamethasone sodium phosphate 6 (3-3) MG/ML ?Procedure, treatment alternatives, risks and benefits explained, specific risks discussed. Consent was given by the patient. Immediately prior to procedure a time out was called to verify the correct patient, procedure, equipment, support staff and site/side marked as required. Patient was prepped and draped in the usual sterile fashion.  ? ? ? ? ?Clinical Data: ?No additional findings. ? ? ?Subjective: ?Chief Complaint  ?Patient presents  with  ? Right Hand - Pain, Numbness  ?  Right handed, pain :5/10, +n/t, swelling, feels like a toothache, states that it feels like the numbness starts in the shoulders and radiates down the arm.  ? Left Hand - Pain, Numbness  ? ? ?This is a 56 year old right-hand-dominant female presents with numbness and tingling in her index, middle, ring, and small finger tips on both hands as well as diffuse aching pain from her shoulder into her hand.  She notes that her biggest issue is the aching that goes from her shoulders all the way to the fingertips.  This happens intermittently and appears to be random.  She says she has aching pain in her fingertips during certain activities such as driving.  She says she will occasionally wake at night with her arms aching and with numbness and tingling in the fingertips.  She is a prediabetic and her last A1c was 6.1.  She does have a history of diagnosed idiopathic peripheral neuropathy but patient was unfamiliar with this term and is not sure where the diagnosis originated.  She denies any radiculopathy type symptoms.  She denies any injury to either extremity.  She had no treatment so far.  She did have an EMG/nerve conduction study on 06/11/2021 which was normal. ? ? ?Review of Systems ? ? ?Objective: ?Vital Signs: BP 123/76 (BP Location: Left Arm, Patient Position: Sitting)   Pulse 71   Ht '5\' 5"'$  (1.651 m)   Wt 199 lb 8  oz (90.5 kg)   BMI 33.20 kg/m?  ? ?Physical Exam ?Constitutional:   ?   Appearance: Normal appearance.  ?Cardiovascular:  ?   Rate and Rhythm: Normal rate.  ?   Pulses: Normal pulses.  ?Pulmonary:  ?   Effort: Pulmonary effort is normal.  ?Skin: ?   General: Skin is warm and dry.  ?   Capillary Refill: Capillary refill takes less than 2 seconds.  ?Neurological:  ?   Mental Status: She is alert.  ? ? ?Right Hand Exam  ? ?Tenderness  ?The patient is experiencing no tenderness.  ? ?Other  ?Erythema: absent ?Sensation: normal ?Pulse: present ? ?Comments:  Patient  with some difficulty following directions on exam.  She does have a questionable Tinel's sign and notes that it makes her fingers "feel funny."  She has a positive Phalen sign with numbness in the index, middle, and ring fingers.  She has a questionable Durkan sign and notes that her fingers "feel warm."  She has 5.5 thenar motor strength.  She able to make a full and complete fist. ? ? ?Left Hand Exam  ? ?Tenderness  ?The patient is experiencing no tenderness.  ? ?Other  ?Erythema: absent ?Sensation: normal ?Pulse: present ? ?Comments:  Negative Tinel's sign.  Questionable Phalen sign notes that her "fingers hurt."  Negative Durkin sign.  5 out of 5 thenar motor strength.  Able to make a full complete fist. ? ? ? ? ?Specialty Comments:  ?No specialty comments available. ? ?Imaging: ?No results found. ? ? ?PMFS History: ?Patient Active Problem List  ? Diagnosis Date Noted  ? Bilateral hand numbness 08/04/2021  ? Bilateral carpal tunnel syndrome 05/21/2021  ? Idiopathic peripheral neuropathy 05/21/2021  ? Prediabetes 03/04/2021  ? Obesity (BMI 30-39.9) 03/04/2021  ? Atypical chest pain 02/18/2021  ? Hyperlipidemia 07/10/2020  ? HTN (hypertension) 07/10/2020  ? Vitamin D deficiency 07/10/2020  ? Impacted cerumen of left ear 07/10/2020  ? FH: colon cancer in first degree relative <6 years old 04/19/2018  ? Irritable bowel syndrome 05/19/2017  ? Migraines 05/19/2017  ? Gastroesophageal reflux disease 04/04/2009  ? Constipation 04/04/2009  ? ?Past Medical History:  ?Diagnosis Date  ? Arthritis   ? Chronic constipation   ? GERD (gastroesophageal reflux disease)   ? H/O: hysterectomy 07/10/2020  ? IBS (irritable bowel syndrome)   ? Nausea and vomiting   ? chronic  ? Syncope 05/19/2017  ?  ?Family History  ?Problem Relation Age of Onset  ? Breast cancer Cousin   ? Hypertension Mother   ? Cancer Father   ?     colon cancer, age 76  ? Cancer Sister   ?     patient doesn't know what kind  ? Cancer Brother   ?     not sure  primary  ?  ?Past Surgical History:  ?Procedure Laterality Date  ? ABDOMINAL HYSTERECTOMY    ? BREAST BIOPSY Left   ? neg  ? COLONOSCOPY N/A 06/23/2018  ? Procedure: COLONOSCOPY;  Surgeon: Danie Binder, MD;  [facility];  Service: Endoscopy;  Laterality: N/A;  1:00pm  ? COLONOSCOPY WITH ESOPHAGOGASTRODUODENOSCOPY (EGD)  03/2009  ? Dr. Oneida Alar: Tortuous colon.  Otherwise normal colon.  Moderate sized sliding hiatal hernia.  Multiple erosions in the antrum.  Small bowel biopsies negative for celiac disease.  ? POLYPECTOMY  06/23/2018  ? Procedure: POLYPECTOMY;  Surgeon: Danie Binder, MD;  [facility]: [facility];  Service: Endoscopy;;  transverse  colon polyp  ? ?Social History  ? ?Occupational History  ? Not on file  ?Tobacco Use  ? Smoking status: Never  ? Smokeless tobacco: Never  ?Vaping Use  ? Vaping Use: Never used  ?Substance and Sexual Activity  ? Alcohol use: Yes  ?  Comment: ocassional  ? Drug use: No  ? Sexual activity: Not on file  ? ? ? ? ? ? ?

## 2021-08-13 ENCOUNTER — Ambulatory Visit (HOSPITAL_COMMUNITY): Payer: BC Managed Care – PPO | Attending: Orthopaedic Surgery

## 2021-08-13 ENCOUNTER — Encounter (HOSPITAL_COMMUNITY): Payer: Self-pay

## 2021-08-13 DIAGNOSIS — M542 Cervicalgia: Secondary | ICD-10-CM | POA: Diagnosis not present

## 2021-08-13 NOTE — Therapy (Signed)
?OUTPATIENT OCCUPATIONAL THERAPY ORTHO EVALUATION ? ?Patient Name: Tara Wolf ?MRN: [id number] ?DOB:1966/03/14, 56 y.o., female ?Today's Date: 08/18/2021 ? ?PCP: Ihor Dow, MD ?REFERRING PROVIDER: Sanjuana Kava, MD ?Currently seeing: Sherilyn Cooter, MD ? ? OT End of Session - 08/18/21 1937   ? ? Visit Number 1   ? Number of Visits 4   ? Date for OT Re-Evaluation 09/10/21   ? Authorization Type [employer] commercial PPO   ? Authorization Time Period 04/13/21-04/12/22 no auth. 30 visit limit PT/OT/Chiro   ? Authorization - Visit Number 1   ? Authorization - Number of Visits 30   ? OT Start Time [phone]   ? OT Stop Time 1025   ? OT Time Calculation (min) 35 min   ? Activity Tolerance Patient tolerated treatment well   ? Behavior During Therapy [facility] for tasks assessed/performed   ? ?  ?  ? ?  ? ? ?Past Medical History:  ?Diagnosis Date  ? Arthritis   ? Chronic constipation   ? GERD (gastroesophageal reflux disease)   ? H/O: hysterectomy 07/10/2020  ? IBS (irritable bowel syndrome)   ? Nausea and vomiting   ? chronic  ? Syncope 05/19/2017  ? ?Past Surgical History:  ?Procedure Laterality Date  ? ABDOMINAL HYSTERECTOMY    ? BREAST BIOPSY Left   ? neg  ? COLONOSCOPY N/A 06/23/2018  ? Procedure: COLONOSCOPY;  Surgeon: Danie Binder, MD;  [facility];  Service: Endoscopy;  Laterality: N/A;  1:00pm  ? COLONOSCOPY WITH ESOPHAGOGASTRODUODENOSCOPY (EGD)  03/2009  ? Dr. Oneida Alar: Tortuous colon.  Otherwise normal colon.  Moderate sized sliding hiatal hernia.  Multiple erosions in the antrum.  Small bowel biopsies negative for celiac disease.  ? POLYPECTOMY  06/23/2018  ? Procedure: POLYPECTOMY;  Surgeon: Danie Binder, MD;  [facility];  Service: Endoscopy;;  transverse colon polyp  ? ?Patient Active Problem List  ? Diagnosis Date Noted  ? Bilateral hand numbness 08/04/2021  ? Bilateral carpal tunnel syndrome 05/21/2021  ? Idiopathic peripheral neuropathy 05/21/2021  ? Prediabetes 03/04/2021  ? Obesity (BMI  30-39.9) 03/04/2021  ? Atypical chest pain 02/18/2021  ? Hyperlipidemia 07/10/2020  ? HTN (hypertension) 07/10/2020  ? Vitamin D deficiency 07/10/2020  ? Impacted cerumen of left ear 07/10/2020  ? FH: colon cancer in first degree relative <70 years old 04/19/2018  ? Irritable bowel syndrome 05/19/2017  ? Migraines 05/19/2017  ? Gastroesophageal reflux disease 04/04/2009  ? Constipation 04/04/2009  ? ? ?ONSET DATE: at least a year ? ?REFERRING DIAG: Bilateral hand pain ? ?THERAPY DIAG:  ?Cervicalgia ? ?SUBJECTIVE:  ? ?SUBJECTIVE STATEMENT: ?S: My neck hurts on the left side. It goes down my arm. ?Pt accompanied by: self ? ?PERTINENT HISTORY: This is a 56 year old right-hand-dominant female presents with numbness and tingling in her index, middle, ring, and small finger tips on both hands as well as diffuse aching pain from her shoulder into her hand.  She notes that her biggest issue is the aching that goes from her shoulders all the way to the fingertips.  This happens intermittently and appears to be random.  She says she has aching pain in her fingertips during certain activities such as driving.  She says she will occasionally wake at night with her arms aching and with numbness and tingling in the fingertips.  She is a prediabetic and her last A1c was 6.1.  She does have a history of diagnosed idiopathic peripheral neuropathy but patient was unfamiliar with  this term and is not sure where the diagnosis originated.  She denies any radiculopathy type symptoms.  She denies any injury to either extremity.  She had no treatment so far.  She did have an EMG/nerve conduction study on 06/11/2021 which was normal. ? ?PRECAUTIONS: None ? ?WEIGHT BEARING RESTRICTIONS No ? ?PAIN:  ?Are you having pain? Yes: NPRS scale: 3/10 ?Pain location: left side - neck down to arm ?Pain description: ache,  ?Aggravating factors: worse at night when laying down ?Relieving factors: Biofreeze, rub it with alcohol, OTC pain medication ,  heat ?When waking up the pain is the worse. ? ?Right side: wrist to finger a little tingling. Feels stiff. ? ? ? ?FALLS: Has patient fallen in last 6 months? No ? ?LIVING ENVIRONMENT: ?Lives with: lives with their family and lives with their spouse ? ? ?PLOF: Independent ? ?PATIENT GOALS To figure out what is happening and have no pain and numbness.  ? ?OBJECTIVE:  ? ?HAND DOMINANCE: Right ? ?ADLs: ?Overall ADLs: Able to complete all ADL tasks.  ? ? ? ? ?UE ROM    ? ?Active ROM - standing. IR/er adducted Right ?08/18/2021 Left ?08/18/2021  ?Shoulder flexion WFL 105  ?Shoulder abduction WFL 81  ?Shoulder internal rotation WFL 90  ?Shoulder external rotation WFl 31  ?(Blank rows = not tested) ?Cervical A/ROM is WFL in all ranges with the exception of left side lateral bend is limited. ? ? ? ?UE MMT:    ? ?MMT Right ?08/18/2021 Left ?08/18/2021  ?Shoulder flexion 5/5 4+/5  ?Shoulder abduction 5/5 4+/5  ?Shoulder internal rotation 5/5 5/5  ?Shoulder external rotation 5/5 5/5  ?(Blank rows = not tested) ? ?HAND FUNCTION: ?Grip strength: Right: 44 lbs; Left: 100 lbs ? ?COORDINATION: ?Reports being clumsy but that is her normal. ? ?SENSATION: ?Reports tingling in th right hand /wrist ? ?EDEMA: Reports swelling in the right hand after injection from Dr. Tempie Donning. ? ?COGNITION: ?Overall cognitive status: Within functional limits for tasks assessed ? ? ?OBSERVATIONS: Max fascial restriction in bilateral upper trapezius and scapularis region region. Max fascial restriction noted in left lateral neck region ? ? ?TODAY'S TREATMENT:  ?Discussed possible CTS diagnosis per Dr. Tempie Donning. Also discussed possible cervical nerve issue. Assessed fascial restrictions in neck and upper trapezius region. ? ? ?PATIENT EDUCATION: ?Education details: Try new pillows. Recommended trial with a memory foam pillow with a curve for proper neck alignment and support.  ?Person educated: Patient ?Education method: Explanation and Demonstration ?Education  comprehension: verbalized understanding ? ? ?HOME EXERCISE PROGRAM: ?Establish HEP at first treatment.  ? ?GOALS: ? ? ?SHORT TERM GOALS: Target date: 09/10/2021 ? ?Patient will be educated and independent with HEP in order to facilitate her progress in therapy and decrease the amount of pain and discomfort felt in BUE. ?Baseline: ?Goal status: INITIAL ? ?2.  Pt will increase her LUE A/ROM to WNL in order to increase her ability to reach above shoulder level with less difficulty.  ?Baseline:  ?Goal status: INITIAL ? ?3.  Patient will increase her overall LUE strength to 5/5 in order to manage weight with both arms as needed during daily tasks.  ?Baseline:  ?Goal status: INITIAL ? ?4.  Pt will decrease fascial restrictions to min amount in upper trapezius and cervical region bilaterally in order to increase the functional mobility needed to complete all reaching tasks.  ?Baseline:  ?Goal status: INITIAL ? ? ? ? ? ?ASSESSMENT: ? ?CLINICAL IMPRESSION: ?Patient is a 56 y.o. female who was  seen today for occupational therapy evaluation for bilateral hand pain and numbness causing difficulty completing daily tasks due to discomfort. Recent nerve conduction test for CTS returned normal. I'm suspecting that the issue may be some time of nerve; possible pinched in the cervical region.   ? ?PERFORMANCE DEFICITS in functional skills including ADLs, IADLs, ROM, strength, pain, fascial restrictions, mobility, and UE functional use. ? ?IMPAIRMENTS are limiting patient from ADLs, IADLs, rest and sleep, and leisure.  ? ?COMORBIDITIES may have co-morbidities  that affects occupational performance. Patient will benefit from skilled OT to address above impairments and improve overall function. ? ?MODIFICATION OR ASSISTANCE TO COMPLETE EVALUATION: Min-Moderate modification of tasks or assist with assess necessary to complete an evaluation. ? ?OT OCCUPATIONAL PROFILE AND HISTORY: Detailed assessment: Review of records and additional  review of physical, cognitive, psychosocial history related to current functional performance. ? ?CLINICAL DECISION MAKING: Moderate - several treatment options, min-mod task modification necessary ? ?REHAB POTENT

## 2021-09-10 ENCOUNTER — Encounter (HOSPITAL_COMMUNITY): Payer: BC Managed Care – PPO | Admitting: Occupational Therapy

## 2021-09-18 ENCOUNTER — Ambulatory Visit: Payer: BC Managed Care – PPO | Admitting: Internal Medicine

## 2021-10-10 ENCOUNTER — Other Ambulatory Visit: Payer: Self-pay | Admitting: Internal Medicine

## 2021-10-10 DIAGNOSIS — I10 Essential (primary) hypertension: Secondary | ICD-10-CM

## 2021-10-15 ENCOUNTER — Ambulatory Visit: Payer: BC Managed Care – PPO | Admitting: Internal Medicine

## 2021-10-17 ENCOUNTER — Other Ambulatory Visit: Payer: Self-pay

## 2021-10-17 ENCOUNTER — Ambulatory Visit
Admission: EM | Admit: 2021-10-17 | Discharge: 2021-10-17 | Disposition: A | Discharge status: Home / Self Care | Payer: BC Managed Care – PPO | Attending: Nurse Practitioner | Admitting: Nurse Practitioner

## 2021-10-17 ENCOUNTER — Encounter: Payer: Self-pay | Admitting: Emergency Medicine

## 2021-10-17 ENCOUNTER — Ambulatory Visit (INDEPENDENT_AMBULATORY_CARE_PROVIDER_SITE_OTHER): Payer: BC Managed Care – PPO

## 2021-10-17 ENCOUNTER — Telehealth: Payer: Self-pay

## 2021-10-17 DIAGNOSIS — R350 Frequency of micturition: Secondary | ICD-10-CM | POA: Diagnosis not present

## 2021-10-17 DIAGNOSIS — R042 Hemoptysis: Secondary | ICD-10-CM | POA: Insufficient documentation

## 2021-10-17 DIAGNOSIS — R600 Localized edema: Secondary | ICD-10-CM | POA: Insufficient documentation

## 2021-10-17 LAB — POCT URINALYSIS DIP (MANUAL ENTRY)
Bilirubin, UA: NEGATIVE
Glucose, UA: NEGATIVE mg/dL
Ketones, POC UA: NEGATIVE mg/dL
Leukocytes, UA: NEGATIVE
Nitrite, UA: NEGATIVE
Protein Ur, POC: NEGATIVE mg/dL
Spec Grav, UA: >=1.03 — AB (ref 1.010–1.025)
Urobilinogen, UA: 1 E.U./dL
pH, UA: 6 (ref 5.0–8.0)

## 2021-10-17 MED ORDER — PSEUDOEPH-BROMPHEN-DM 30-2-10 MG/5ML PO SYRP: 5.0000 mL | ORAL_SOLUTION | Freq: Four times a day (QID) | ORAL | 0 refills | Status: DC | PRN

## 2021-10-17 NOTE — Discharge Instructions (Addendum)
Your chest x-ray is negative.  Your urinalysis is also negative.  A urine culture has been ordered for confirmatory testing.  You will be contacted if you need to be started on an antibiotic. Take medication as prescribed. To help with the swelling in your legs and feet, please take 2 of your hydrochlorothiazide  (HCTZ) tablets daily for the next 5 days. Elevate your legs and feet as much as possible. Recommend purchasing compression hose to help with the swelling in your legs and feet.  Recommend wearing these when you are at work and on your feet for extended periods of time. Wear shoes with good insole and support. If you develop worsening shortness of breath, difficulty breathing, inability to speak in a complete sentence, blood production in your sputum that appears to be clots or becoming larger in size.  Please go to the emergency department immediately. Follow-up with your PCP as scheduled for July 20th.

## 2021-10-17 NOTE — ED Triage Notes (Signed)
Pt reports bilateral lower leg swelling, increased urinary frequency, intermittent shortness of breath and "coughing up blood in the mornings" since Tuesday. Nad noted. Airway patent.pt also reports migraines at baseline and states "had a a headache since this all started too."

## 2021-10-17 NOTE — Telephone Encounter (Signed)
Pt told to take 2 of the hydrochlorothiazide (HCTZ) she already taking, prescription for this was not sent.

## 2021-10-17 NOTE — ED Provider Notes (Signed)
[facility]    CSN: [id number] Arrival date & time: 10/17/21  0908      History   Chief Complaint Chief Complaint  Patient presents with   Leg Swelling    HPI Tara Wolf is a 56 y.o. female.   HPI  Patient presents for multiple complaints to include urinary frequency, lower extremity edema, and 2-3 episodes of coughing up blood over the past 2 to 3 days.    Urinary frequency has been present for the past several days.  She denies fever, chills, foul-smelling urine, hematuria, abdominal pain, or low back pain.  She has also had lower extremity edema that has been present for the past 2 to 3 days.  She complains of pain in her bilateral legs and feet.  She states that she has been elevating her legs and feet.  She complains of shortness of breath.  She denies chest pain or difficulty breathing.  She does have a history of hypertension, she currently takes hydrochlorothiazide and amlodipine.  She is being seen by Dr. Posey Pronto.  Patient reports 2-3 episodes of hemoptysis that started approximately 2 to 3 days ago.  She states this morning when she coughed up the blood, it was "really red".  She complains of intermittent shortness of breath.  Denies fever, chills, difficulty breathing. She complains of intermittent shortness of breath.  She also states that she has had increased pain in her legs and feet.  She denies fever, chills, wheezing, difficulty breathing, abdominal pain, nausea, vomiting, or diarrhea.  Denies previous history of smoking, asthma, or other lung disease.  Past Medical History:  Diagnosis Date   Arthritis    Chronic constipation    GERD (gastroesophageal reflux disease)    H/O: hysterectomy 07/10/2020   IBS (irritable bowel syndrome)    Nausea and vomiting    chronic   Syncope 05/19/2017    Patient Active Problem List   Diagnosis Date Noted   Bilateral hand numbness 08/04/2021   Bilateral carpal tunnel syndrome 05/21/2021   Idiopathic  peripheral neuropathy 05/21/2021   Prediabetes 03/04/2021   Obesity (BMI 30-39.9) 03/04/2021   Atypical chest pain 02/18/2021   Hyperlipidemia 07/10/2020   HTN (hypertension) 07/10/2020   Vitamin D deficiency 07/10/2020   Impacted cerumen of left ear 07/10/2020   FH: colon cancer in first degree relative <42 years old 04/19/2018   Irritable bowel syndrome 05/19/2017   Migraines 05/19/2017   Gastroesophageal reflux disease 04/04/2009   Constipation 04/04/2009    Past Surgical History:  Procedure Laterality Date   ABDOMINAL HYSTERECTOMY     BREAST BIOPSY Left    neg   COLONOSCOPY N/A 06/23/2018   Procedure: COLONOSCOPY;  Surgeon: Danie Binder, MD;  Location: [facility];  Service: Endoscopy;  Laterality: N/A;  1:00pm   COLONOSCOPY WITH ESOPHAGOGASTRODUODENOSCOPY (EGD)  03/2009   Dr. Oneida Alar: Tortuous colon.  Otherwise normal colon.  Moderate sized sliding hiatal hernia.  Multiple erosions in the antrum.  Small bowel biopsies negative for celiac disease.   POLYPECTOMY  06/23/2018   Procedure: POLYPECTOMY;  Surgeon: Danie Binder, MD;  Location: [facility];  Service: Endoscopy;;  transverse colon polyp    OB History   No obstetric history on file.      Home Medications    Prior to Admission medications   Medication Sig Start Date End Date Taking? Authorizing Provider  brompheniramine-pseudoephedrine-DM 30-2-10 MG/5ML syrup Take 5 mLs by mouth 4 (four) times daily as needed. 10/17/21  Yes  Bryon Parker-Warren, Alda Lea, NP  acetaminophen (TYLENOL) 325 MG tablet Take 650 mg by mouth every 6 (six) hours as needed for mild pain, fever or headache.    [provider]  amLODipine (NORVASC) 5 MG tablet TAKE 1 TABLET(5 MG) BY MOUTH DAILY 10/10/21   Lindell Spar, MD  ASPIRIN 81 PO Take by mouth daily.    [provider]  diclofenac (VOLTAREN) 75 MG EC tablet Take 1 tablet (75 mg total) by mouth 2 (two) times daily with a meal. 06/17/21   Sanjuana Kava, MD   hydrochlorothiazide (MICROZIDE) 12.5 MG capsule TAKE 1 CAPSULE(12.5 MG) BY MOUTH DAILY 05/22/21   Lindell Spar, MD  nitroGLYCERIN (NITROSTAT) 0.4 MG SL tablet Place 1 tablet (0.4 mg total) under the tongue every 5 (five) minutes as needed for chest pain. 03/04/21 07/08/21  Tobb, Kardie, DO  omeprazole (PRILOSEC) 20 MG capsule TAKE 1 CAPSULE(20 MG) BY MOUTH DAILY 05/22/21   Lindell Spar, MD  rosuvastatin (CRESTOR) 5 MG tablet TAKE 1 TABLET(5 MG) BY MOUTH DAILY 07/30/21   Lindell Spar, MD    Family History Family History  Problem Relation Age of Onset   Breast cancer Cousin    Hypertension Mother    Cancer Father        colon cancer, age 39   Cancer Sister        patient doesn't know what kind   Cancer Brother        not sure primary    Social History Social History   Tobacco Use   Smoking status: Never   Smokeless tobacco: Never  Vaping Use   Vaping Use: Never used  Substance Use Topics   Alcohol use: Yes    Comment: ocassional   Drug use: No     Allergies   Patient has no known allergies.   Review of Systems Review of Systems Per HPI  Physical Exam Triage Vital Signs ED Triage Vitals [10/17/21 1018]  Enc Vitals Group     BP 130/69     Pulse Rate 67     Resp 18     Temp 98.2 F (36.8 C)     Temp Source Oral     SpO2 98 %     Weight      Height      Head Circumference      Peak Flow      Pain Score 8     Pain Loc      Pain Edu?      Excl. in [place]?    No data found.  Updated Vital Signs BP 130/69 (BP Location: Right Arm)   Pulse 67   Temp 98.2 F (36.8 C) (Oral)   Resp 18   SpO2 98%   Visual Acuity Right Eye Distance:   Left Eye Distance:   Bilateral Distance:    Right Eye Near:   Left Eye Near:    Bilateral Near:     Physical Exam Vitals and nursing note reviewed.  Constitutional:      General: She is not in acute distress.    Appearance: She is well-developed.  HENT:     Head: Normocephalic.     Nose: Nose normal.  Eyes:      Extraocular Movements: Extraocular movements intact.     Conjunctiva/sclera: Conjunctivae normal.     Pupils: Pupils are equal, round, and reactive to light.  Cardiovascular:     Rate and Rhythm: Normal rate and regular rhythm.  Pulses: Normal pulses.     Heart sounds: Normal heart sounds, S1 normal and S2 normal.  Pulmonary:     Effort: Pulmonary effort is normal. No respiratory distress.     Breath sounds: Normal breath sounds. No wheezing or rales.  Chest:     Chest wall: No tenderness.  Abdominal:     General: Bowel sounds are normal. There is no distension.     Palpations: Abdomen is soft.     Tenderness: There is no abdominal tenderness. There is no guarding or rebound.  Genitourinary:    Vagina: Normal. No vaginal discharge.  Musculoskeletal:     Cervical back: Normal range of motion.     Right lower leg: 1+ Edema present.     Left lower leg: 1+ Edema present.  Lymphadenopathy:     Cervical: No cervical adenopathy.  Skin:    General: Skin is warm and dry.     Findings: No erythema or rash.  Neurological:     General: No focal deficit present.     Mental Status: She is alert and oriented to person, place, and time.     Cranial Nerves: No cranial nerve deficit.  Psychiatric:        Mood and Affect: Mood normal.        Behavior: Behavior normal.        Thought Content: Thought content normal.      UC Treatments / Results  Labs (all labs ordered are listed, but only abnormal results are displayed) Labs Reviewed  POCT URINALYSIS DIP (MANUAL ENTRY) - Abnormal; Notable for the following components:      Result Value   Spec Grav, UA >=1.030 (*)    Blood, UA trace-lysed (*)    All other components within normal limits  URINE CULTURE    EKG   Radiology DG Chest 2 View  Result Date: 10/17/2021 CLINICAL DATA:  Hemoptysis EXAM: CHEST - 2 VIEW COMPARISON:  Chest radiograph 06/12/2020 FINDINGS: The cardiomediastinal contours are within normal limits. The lungs are  clear. No pneumothorax or pleural effusion. No acute finding in the visualized skeleton. IMPRESSION: No acute cardiopulmonary process. Electronically Signed   By: Audie Pinto M.D.   On: 10/17/2021 10:58    Procedures Procedures (including critical care time)  Medications Ordered in UC Medications - No data to display  Initial Impression / Assessment and Plan / UC Course  I have reviewed the triage vital signs and the nursing notes.  Pertinent labs & imaging results that were available during my care of the patient were reviewed by me and considered in my medical decision making (see chart for details).  Patient presents for multiple complaints to include shortness of breath, hemoptysis, urinary frequency, and lower extremity edema.  Patient's exam is benign, her vital signs are stable.  The chest x-ray was performed which did not show any acute findings.  Her urinalysis was also negative for urinary tract infection, urine culture is pending.  There is no concern for pyelonephritis in the absence of CVA tenderness, and fever.  Patient currently takes HCTZ 12.5 mg daily.  Recommend that she take 2 tablets until she is seen by her primary care physician to help with her lower extremity edema.  Supportive care recommendations were also provided to the patient with strict indications of when to go to the emergency department.  Patient advised to follow-up as needed. Final Clinical Impressions(s) / UC Diagnoses   Final diagnoses:  Urinary frequency  Hemoptysis  Bilateral lower  extremity edema     Discharge Instructions      Your chest x-ray is negative.  Your urinalysis is also negative.  A urine culture has been ordered for confirmatory testing.  You will be contacted if you need to be started on an antibiotic. Take medication as prescribed. To help with the swelling in your legs and feet, please take 2 of your hydrochlorothiazide  (HCTZ) tablets daily for the next 5 days. Elevate your  legs and feet as much as possible. Recommend purchasing compression hose to help with the swelling in your legs and feet.  Recommend wearing these when you are at work and on your feet for extended periods of time. Wear shoes with good insole and support. If you develop worsening shortness of breath, difficulty breathing, inability to speak in a complete sentence, blood production in your sputum that appears to be clots or becoming larger in size.  Please go to the emergency department immediately. Follow-up with your PCP as scheduled for July 20th.      ED Prescriptions     Medication Sig Dispense Auth. Provider   brompheniramine-pseudoephedrine-DM 30-2-10 MG/5ML syrup Take 5 mLs by mouth 4 (four) times daily as needed. 140 mL Keiston Manley-Warren, Alda Lea, NP      PDMP not reviewed this encounter.   Tish Men, NP 10/17/21 1120

## 2021-10-19 LAB — URINE CULTURE: Culture: 40000 — AB

## 2021-10-23 ENCOUNTER — Telehealth: Payer: Self-pay | Admitting: Orthopaedic Surgery

## 2021-10-30 ENCOUNTER — Encounter: Payer: Self-pay | Admitting: Internal Medicine

## 2021-10-30 ENCOUNTER — Ambulatory Visit: Payer: BC Managed Care – PPO | Admitting: Internal Medicine

## 2021-10-30 VITALS — BP 138/68 | HR 66 | Ht 69.0 in | Wt 199.6 lb

## 2021-10-30 DIAGNOSIS — M7989 Other specified soft tissue disorders: Secondary | ICD-10-CM | POA: Diagnosis not present

## 2021-10-30 DIAGNOSIS — I10 Essential (primary) hypertension: Secondary | ICD-10-CM

## 2021-10-30 DIAGNOSIS — K219 Gastro-esophageal reflux disease without esophagitis: Secondary | ICD-10-CM | POA: Diagnosis not present

## 2021-10-30 DIAGNOSIS — R7303 Prediabetes: Secondary | ICD-10-CM

## 2021-10-30 DIAGNOSIS — J029 Acute pharyngitis, unspecified: Secondary | ICD-10-CM

## 2021-10-30 DIAGNOSIS — E559 Vitamin D deficiency, unspecified: Secondary | ICD-10-CM

## 2021-10-30 DIAGNOSIS — Z23 Encounter for immunization: Secondary | ICD-10-CM

## 2021-10-30 DIAGNOSIS — Z1329 Encounter for screening for other suspected endocrine disorder: Secondary | ICD-10-CM

## 2021-10-30 DIAGNOSIS — E782 Mixed hyperlipidemia: Secondary | ICD-10-CM

## 2021-10-30 MED ORDER — HYDROCHLOROTHIAZIDE 25 MG PO TABS: 25.0000 mg | ORAL_TABLET | Freq: Every day | ORAL | 3 refills | Status: DC

## 2021-10-30 MED ORDER — AZITHROMYCIN 250 MG PO TABS: ORAL_TABLET | ORAL | 0 refills | Status: AC

## 2021-10-30 NOTE — Assessment & Plan Note (Signed)
Well-controlled with Omeprazole 

## 2021-10-30 NOTE — Assessment & Plan Note (Signed)
BP Readings from Last 1 Encounters:  10/30/21 138/68   Uncontrolled with Amlodipine 5 mg QD and HCTZ 12.5 mg QD now Increased dose of HCTZ to 25 mg daily Counseled for compliance with the medications Advised DASH diet and moderate exercise/walking, at least 150 mins/week

## 2021-10-30 NOTE — Progress Notes (Signed)
Established Patient Office Visit  Subjective:  Patient ID: Tara Wolf, female    DOB: 1965-11-28  Age: 56 y.o. MRN: [id number]  CC:  Chief Complaint  Patient presents with   Follow-up    4 month follow up, states bp has been up and down, feels sx of headache and dizziness on and off.    Sore Throat    Pt states she went to urgent care on 07/07 for sore throat they have her cough syrup but has not helped, still having sx of sore throat and pain when swallowing.     HPI Tara Wolf is a 56 y.o. female with past medical history of HTN, GERD, IBS-C and HLD who presents for follow up of her chronic medical conditions presents for f/u of her chronic medical conditions.  HTN: Her BP was borderline elevated today.  She reports that her BP has been elevated at home as well and has intermittent generalized headache and dizziness.  She denies any chest pain, dyspnea or palpitations.  She recently went to urgent care for complaint of sore throat, nasal congestion congestion and postnasal drip X 2 weeks.  She denies any fever, chills, dyspnea or wheezing currently.  She was given symptomatic treatment from urgent care.  She received first dose of Shingrix vaccine today.  Past Medical History:  Diagnosis Date   Arthritis    Chronic constipation    GERD (gastroesophageal reflux disease)    H/O: hysterectomy 07/10/2020   IBS (irritable bowel syndrome)    Nausea and vomiting    chronic   Syncope 05/19/2017    Past Surgical History:  Procedure Laterality Date   ABDOMINAL HYSTERECTOMY     BREAST BIOPSY Left    neg   COLONOSCOPY N/A 06/23/2018   Procedure: COLONOSCOPY;  Surgeon: Danie Binder, MD;  Location: [facility];  Service: Endoscopy;  Laterality: N/A;  1:00pm   COLONOSCOPY WITH ESOPHAGOGASTRODUODENOSCOPY (EGD)  03/2009   Dr. Oneida Alar: Tortuous colon.  Otherwise normal colon.  Moderate sized sliding hiatal hernia.  Multiple erosions in the antrum.  Small bowel  biopsies negative for celiac disease.   POLYPECTOMY  06/23/2018   Procedure: POLYPECTOMY;  Surgeon: Danie Binder, MD;  Location: [facility];  Service: Endoscopy;;  transverse colon polyp    Family History  Problem Relation Age of Onset   Breast cancer Cousin    Hypertension Mother    Cancer Father        colon cancer, age 28   Cancer Sister        patient doesn't know what kind   Cancer Brother        not sure primary    Social History   Socioeconomic History   Marital status: Single    Spouse name: Not on file   Number of children: Not on file   Years of education: Not on file   Highest education level: Not on file  Occupational History   Not on file  Tobacco Use   Smoking status: Never   Smokeless tobacco: Never  Vaping Use   Vaping Use: Never used  Substance and Sexual Activity   Alcohol use: Yes    Comment: ocassional   Drug use: No   Sexual activity: Not on file  Other Topics Concern   Not on file  Social History Narrative   Not on file   Social Determinants of Health   Financial Resource Strain: Not on file  Food Insecurity: Not on  file  Transportation Needs: Not on file  Physical Activity: Not on file  Stress: Not on file  Social Connections: Not on file  Intimate Partner Violence: Not on file    Outpatient Medications Prior to Visit  Medication Sig Dispense Refill   acetaminophen (TYLENOL) 325 MG tablet Take 650 mg by mouth every 6 (six) hours as needed for mild pain, fever or headache.     amLODipine (NORVASC) 5 MG tablet TAKE 1 TABLET(5 MG) BY MOUTH DAILY 30 tablet 5   ASPIRIN 81 PO Take by mouth daily.     diclofenac (VOLTAREN) 75 MG EC tablet TAKE 1 TABLET(75 MG) BY MOUTH TWICE DAILY WITH A MEAL 60 tablet 2   omeprazole (PRILOSEC) 20 MG capsule TAKE 1 CAPSULE(20 MG) BY MOUTH DAILY 90 capsule 3   rosuvastatin (CRESTOR) 5 MG tablet TAKE 1 TABLET(5 MG) BY MOUTH DAILY 90 tablet 1   hydrochlorothiazide (MICROZIDE) 12.5 MG capsule TAKE 1  CAPSULE(12.5 MG) BY MOUTH DAILY 90 capsule 3   nitroGLYCERIN (NITROSTAT) 0.4 MG SL tablet Place 1 tablet (0.4 mg total) under the tongue every 5 (five) minutes as needed for chest pain. 90 tablet 3   brompheniramine-pseudoephedrine-DM 30-2-10 MG/5ML syrup Take 5 mLs by mouth 4 (four) times daily as needed. (Patient not taking: Reported on 10/30/2021) 140 mL 0   No facility-administered medications prior to visit.    No Known Allergies  ROS Review of Systems  Constitutional:  Negative for chills and fever.  HENT:  Negative for congestion, ear discharge, sinus pressure, sinus pain and sore throat.   Eyes:  Negative for pain and discharge.  Respiratory:  Negative for cough and shortness of breath.   Cardiovascular:  Positive for leg swelling. Negative for palpitations.  Gastrointestinal:  Positive for constipation. Negative for abdominal pain, diarrhea, nausea and vomiting.  Endocrine: Negative for polydipsia and polyuria.  Genitourinary:  Negative for dysuria and hematuria.  Musculoskeletal:  Positive for myalgias. Negative for neck pain and neck stiffness.  Skin:  Negative for rash.  Neurological:  Positive for numbness (B/l hands). Negative for dizziness and weakness.  Psychiatric/Behavioral:  Positive for sleep disturbance. Negative for agitation and behavioral problems.       Objective:    Physical Exam Vitals reviewed.  Constitutional:      General: She is not in acute distress.    Appearance: She is not diaphoretic.  HENT:     Head: Normocephalic and atraumatic.     Nose: Nose normal.     Mouth/Throat:     Mouth: Mucous membranes are moist.     Pharynx: Posterior oropharyngeal erythema present.  Eyes:     General: No scleral icterus.    Extraocular Movements: Extraocular movements intact.  Cardiovascular:     Rate and Rhythm: Normal rate and regular rhythm.     Pulses: Normal pulses.     Heart sounds: Normal heart sounds. No murmur heard. Pulmonary:     Breath  sounds: Normal breath sounds. No wheezing or rales.  Abdominal:     Palpations: Abdomen is soft.     Tenderness: There is no abdominal tenderness.  Musculoskeletal:     Cervical back: Neck supple. No tenderness.     Right lower leg: Edema (Mild) present.     Left lower leg: Edema (Mild) present.  Skin:    General: Skin is warm.     Findings: No rash.  Neurological:     General: No focal deficit present.     Mental Status:  She is alert and oriented to person, place, and time.  Psychiatric:        Mood and Affect: Mood normal.        Behavior: Behavior normal.     BP 138/68   Pulse 66   Ht '5\' 9"'  (1.753 m)   Wt 199 lb 9.6 oz (90.5 kg)   SpO2 98%   BMI 29.48 kg/m  Wt Readings from Last 3 Encounters:  10/30/21 199 lb 9.6 oz (90.5 kg)  08/04/21 199 lb 8 oz (90.5 kg)  07/29/21 199 lb 6.4 oz (90.4 kg)    No results found for: "TSH" Lab Results  Component Value Date   WBC 5.7 02/18/2021   HGB 12.7 02/18/2021   HCT 39.4 02/18/2021   MCV 83 02/18/2021   PLT 258 02/18/2021   Lab Results  Component Value Date   NA 139 03/04/2021   K 3.7 03/04/2021   CO2 26 03/04/2021   GLUCOSE 82 03/04/2021   BUN 15 03/04/2021   CREATININE 0.89 03/04/2021   BILITOT 0.3 02/18/2021   ALKPHOS 74 02/18/2021   AST 16 02/18/2021   ALT 14 02/18/2021   PROT 7.3 02/18/2021   ALBUMIN 4.6 02/18/2021   CALCIUM 9.8 03/04/2021   ANIONGAP 9 06/12/2020   EGFR 77 03/04/2021   Lab Results  Component Value Date   CHOL 214 (H) 02/18/2021   Lab Results  Component Value Date   HDL 47 02/18/2021   Lab Results  Component Value Date   LDLCALC 143 (H) 02/18/2021   Lab Results  Component Value Date   TRIG 132 02/18/2021   Lab Results  Component Value Date   CHOLHDL 4.6 (H) 02/18/2021   Lab Results  Component Value Date   HGBA1C 6.2 (H) 02/18/2021      Assessment & Plan:   Problem List Items Addressed This Visit       Cardiovascular and Mediastinum   HTN (hypertension) - Primary     BP Readings from Last 1 Encounters:  10/30/21 138/68  Uncontrolled with Amlodipine 5 mg QD and HCTZ 12.5 mg QD now Increased dose of HCTZ to 25 mg daily Counseled for compliance with the medications Advised DASH diet and moderate exercise/walking, at least 150 mins/week       Relevant Medications   hydrochlorothiazide (HYDRODIURIL) 25 MG tablet   Other Relevant Orders   TSH   CMP14+EGFR   CBC with Differential/Platelet     Digestive   Gastroesophageal reflux disease    Well-controlled with Omeprazole        Other   Hyperlipidemia    On Crestor now Check lipid profile      Relevant Medications   hydrochlorothiazide (HYDRODIURIL) 25 MG tablet   Other Relevant Orders   Lipid panel   Vitamin D deficiency   Relevant Orders   VITAMIN D 25 Hydroxy (Vit-D Deficiency, Fractures)   Prediabetes   Relevant Orders   Hemoglobin A1c   CMP14+EGFR   Leg swelling    Has chronic leg swelling Likely due to chronic venous insufficiency On HCTZ Advised to perform leg elevation as tolerated Compression socks      Other Visit Diagnoses     Acute pharyngitis, unspecified etiology     Considering her persistent sore throat with symptomatic treatment, started empiric Azithromycin Warm water gargling advised Phenol throat spray as needed   Relevant Medications   azithromycin (ZITHROMAX) 250 MG tablet   Other Relevant Orders   CBC with Differential/Platelet   Screening for  thyroid disorder       Relevant Orders   TSH   Immunization due       Relevant Orders   Varicella-zoster vaccine IM (Completed)       Meds ordered this encounter  Medications   hydrochlorothiazide (HYDRODIURIL) 25 MG tablet    Sig: Take 1 tablet (25 mg total) by mouth daily.    Dispense:  90 tablet    Refill:  3   azithromycin (ZITHROMAX) 250 MG tablet    Sig: Take 2 tablets on day 1, then 1 tablet daily on days 2 through 5    Dispense:  6 tablet    Refill:  0    Follow-up: Return in about 5  months (around 04/01/2022) for Annual physical.    Lindell Spar, MD

## 2021-10-30 NOTE — Assessment & Plan Note (Signed)
Has chronic leg swelling Likely due to chronic venous insufficiency On HCTZ Advised to perform leg elevation as tolerated Compression socks

## 2021-10-30 NOTE — Assessment & Plan Note (Signed)
On Crestor now Check lipid profile 

## 2021-10-30 NOTE — Patient Instructions (Signed)
Please take Augmentin as prescribed.  Please continue to take other medications as prescribed.  Please continue to follow low salt diet and perform moderate exercise/walking at least 150 mins/week.  Okay to use compression socks for leg swelling. Avoid prolonged standing.

## 2022-02-17 ENCOUNTER — Other Ambulatory Visit: Payer: Self-pay | Admitting: Internal Medicine

## 2022-02-17 DIAGNOSIS — E782 Mixed hyperlipidemia: Secondary | ICD-10-CM

## 2022-03-24 ENCOUNTER — Telehealth: Payer: Self-pay | Admitting: Orthopaedic Surgery

## 2022-04-01 ENCOUNTER — Ambulatory Visit (INDEPENDENT_AMBULATORY_CARE_PROVIDER_SITE_OTHER): Payer: BC Managed Care – PPO | Admitting: Internal Medicine

## 2022-04-01 ENCOUNTER — Encounter: Payer: Self-pay | Admitting: Internal Medicine

## 2022-04-01 VITALS — BP 128/76 | HR 74 | Ht 69.0 in | Wt 202.4 lb

## 2022-04-01 DIAGNOSIS — Z23 Encounter for immunization: Secondary | ICD-10-CM | POA: Diagnosis not present

## 2022-04-01 DIAGNOSIS — I10 Essential (primary) hypertension: Secondary | ICD-10-CM

## 2022-04-01 DIAGNOSIS — H65112 Acute and subacute allergic otitis media (mucoid) (sanguinous) (serous), left ear: Secondary | ICD-10-CM

## 2022-04-01 DIAGNOSIS — E559 Vitamin D deficiency, unspecified: Secondary | ICD-10-CM | POA: Diagnosis not present

## 2022-04-01 DIAGNOSIS — G8929 Other chronic pain: Secondary | ICD-10-CM

## 2022-04-01 DIAGNOSIS — H6123 Impacted cerumen, bilateral: Secondary | ICD-10-CM

## 2022-04-01 DIAGNOSIS — R7303 Prediabetes: Secondary | ICD-10-CM

## 2022-04-01 DIAGNOSIS — Z1329 Encounter for screening for other suspected endocrine disorder: Secondary | ICD-10-CM

## 2022-04-01 DIAGNOSIS — E782 Mixed hyperlipidemia: Secondary | ICD-10-CM | POA: Diagnosis not present

## 2022-04-01 DIAGNOSIS — Z0001 Encounter for general adult medical examination with abnormal findings: Secondary | ICD-10-CM | POA: Diagnosis not present

## 2022-04-01 DIAGNOSIS — M25562 Pain in left knee: Secondary | ICD-10-CM

## 2022-04-01 DIAGNOSIS — M25561 Pain in right knee: Secondary | ICD-10-CM

## 2022-04-01 MED ORDER — AMLODIPINE BESYLATE 5 MG PO TABS: 5.0000 mg | ORAL_TABLET | Freq: Every day | ORAL | 1 refills | Status: DC

## 2022-04-01 MED ORDER — OFLOXACIN 0.3 % OT SOLN: 5.0000 [drp] | Freq: Every day | OTIC | 0 refills | Status: DC

## 2022-04-01 NOTE — Assessment & Plan Note (Signed)
On Crestor now Check lipid profile 

## 2022-04-01 NOTE — Addendum Note (Signed)
Addended by: Everett Graff D on: 04/01/2022 10:10 AM   Modules accepted: Orders

## 2022-04-01 NOTE — Assessment & Plan Note (Addendum)
B/l ear irrigation done Still has waxy material in the ear canal as she could not tolerate the pain from irrigation Has mild erythema in left ear canal, started Ofloxacin ear drops

## 2022-04-01 NOTE — Assessment & Plan Note (Signed)
BP Readings from Last 1 Encounters:  04/01/22 128/76   Well-controlled with Amlodipine 5 mg QD and HCTZ 25 mg QD now Counseled for compliance with the medications Advised DASH diet and moderate exercise/walking, at least 150 mins/week

## 2022-04-01 NOTE — Assessment & Plan Note (Signed)
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

## 2022-04-01 NOTE — Assessment & Plan Note (Signed)
Lab Results  Component Value Date   HGBA1C 6.2 (H) 02/18/2021   Advised to follow low carb diet for now

## 2022-04-01 NOTE — Progress Notes (Signed)
Established Patient Office Visit  Subjective:  Patient ID: Tara Wolf, female    DOB: March 05, 1966  Age: 56 y.o. MRN: [id number]  CC:  Chief Complaint  Patient presents with   Annual Exam    Patient has had ear pain in both ears for two weeks. Pain in right knee and pain in right foot    HPI Tara Wolf is a 56 y.o. female with past medical history of HTN, GERD, IBS-C and HLD who presents for annual physical.  HTN: BP is well-controlled now. Takes medications regularly. Patient denies headache, dizziness, chest pain, dyspnea or palpitations.  Her leg swelling has improved with HCTZ now.  She reports bilateral ear pain for the last 2 weeks, started around 12/06.  Pain is constant.  She has tried applying sweet oil containing eardrops without much relief.  Denies any ear discharge.  Denies any other URTI symptoms.  Denies any fever or chills.  She also complains of right > left knee pain and right foot pain.  Her pain is sharp, intermittent, worse in the mornings and has stiffness as well.  She received flu and second dose of Shingrix vaccine in the office today.     Past Medical History:  Diagnosis Date   Arthritis    Chronic constipation    GERD (gastroesophageal reflux disease)    H/O: hysterectomy 07/10/2020   IBS (irritable bowel syndrome)    Nausea and vomiting    chronic   Syncope 05/19/2017    Past Surgical History:  Procedure Laterality Date   ABDOMINAL HYSTERECTOMY     BREAST BIOPSY Left    neg   COLONOSCOPY N/A 06/23/2018   Procedure: COLONOSCOPY;  Surgeon: Danie Binder, MD;  Location: [facility];  Service: Endoscopy;  Laterality: N/A;  1:00pm   COLONOSCOPY WITH ESOPHAGOGASTRODUODENOSCOPY (EGD)  03/2009   Dr. Oneida Alar: Tortuous colon.  Otherwise normal colon.  Moderate sized sliding hiatal hernia.  Multiple erosions in the antrum.  Small bowel biopsies negative for celiac disease.   POLYPECTOMY  06/23/2018   Procedure: POLYPECTOMY;   Surgeon: Danie Binder, MD;  Location: [facility];  Service: Endoscopy;;  transverse colon polyp    Family History  Problem Relation Age of Onset   Breast cancer Cousin    Hypertension Mother    Cancer Father        colon cancer, age 71   Cancer Sister        patient doesn't know what kind   Cancer Brother        not sure primary    Social History   Socioeconomic History   Marital status: Single    Spouse name: Not on file   Number of children: Not on file   Years of education: Not on file   Highest education level: Not on file  Occupational History   Not on file  Tobacco Use   Smoking status: Never   Smokeless tobacco: Never  Vaping Use   Vaping Use: Never used  Substance and Sexual Activity   Alcohol use: Yes    Comment: ocassional   Drug use: No   Sexual activity: Not on file  Other Topics Concern   Not on file  Social History Narrative   Not on file   Social Determinants of Health   Financial Resource Strain: Not on file  Food Insecurity: Not on file  Transportation Needs: Not on file  Physical Activity: Not on file  Stress: Not on file  Social Connections: Not on file  Intimate Partner Violence: Not on file    Outpatient Medications Prior to Visit  Medication Sig Dispense Refill   esomeprazole (NEXIUM) 40 MG capsule 1 capsule Orally Once a day for 30 day(s)     acetaminophen (TYLENOL) 325 MG tablet Take 650 mg by mouth every 6 (six) hours as needed for mild pain, fever or headache.     ASPIRIN 81 PO Take by mouth daily.     diclofenac (VOLTAREN) 75 MG EC tablet TAKE 1 TABLET(75 MG) BY MOUTH TWICE DAILY WITH A MEAL 60 tablet 5   hydrochlorothiazide (HYDRODIURIL) 25 MG tablet Take 1 tablet (25 mg total) by mouth daily. 90 tablet 3   nitroGLYCERIN (NITROSTAT) 0.4 MG SL tablet Place 1 tablet (0.4 mg total) under the tongue every 5 (five) minutes as needed for chest pain. 90 tablet 3   omeprazole (PRILOSEC) 20 MG capsule TAKE 1 CAPSULE(20 MG) BY MOUTH  DAILY 90 capsule 3   rosuvastatin (CRESTOR) 5 MG tablet TAKE 1 TABLET(5 MG) BY MOUTH DAILY 90 tablet 1   amLODipine (NORVASC) 5 MG tablet TAKE 1 TABLET(5 MG) BY MOUTH DAILY 30 tablet 5   No facility-administered medications prior to visit.    No Known Allergies  ROS Review of Systems  Constitutional:  Negative for chills and fever.  HENT:  Positive for ear pain. Negative for congestion, ear discharge, sinus pressure, sinus pain and sore throat.   Eyes:  Negative for pain and discharge.  Respiratory:  Negative for cough and shortness of breath.   Cardiovascular:  Positive for leg swelling. Negative for palpitations.  Gastrointestinal:  Positive for constipation. Negative for abdominal pain, diarrhea, nausea and vomiting.  Endocrine: Negative for polydipsia and polyuria.  Genitourinary:  Negative for dysuria and hematuria.  Musculoskeletal:  Positive for arthralgias and myalgias. Negative for neck pain and neck stiffness.  Skin:  Negative for rash.  Neurological:  Positive for numbness (B/l hands). Negative for dizziness and weakness.  Psychiatric/Behavioral:  Positive for sleep disturbance. Negative for agitation and behavioral problems.       Objective:    Physical Exam Vitals reviewed.  Constitutional:      General: She is not in acute distress.    Appearance: She is not diaphoretic.  HENT:     Head: Normocephalic and atraumatic.     Right Ear: There is impacted cerumen.     Left Ear: A middle ear effusion is present. There is impacted cerumen.     Nose: Nose normal.     Mouth/Throat:     Mouth: Mucous membranes are moist.     Pharynx: No posterior oropharyngeal erythema.  Eyes:     General: No scleral icterus.    Extraocular Movements: Extraocular movements intact.  Cardiovascular:     Rate and Rhythm: Normal rate and regular rhythm.     Pulses: Normal pulses.     Heart sounds: Normal heart sounds. No murmur heard. Pulmonary:     Breath sounds: Normal breath sounds.  No wheezing or rales.  Abdominal:     Palpations: Abdomen is soft.     Tenderness: There is no abdominal tenderness.  Musculoskeletal:     Cervical back: Neck supple. No tenderness.     Right lower leg: Edema (Mild) present.     Left lower leg: Edema (Mild) present.  Skin:    General: Skin is warm.     Findings: No rash.  Neurological:     General: No focal deficit  present.     Mental Status: She is alert and oriented to person, place, and time.  Psychiatric:        Mood and Affect: Mood normal.        Behavior: Behavior normal.     BP 128/76 (BP Location: Left Arm, Patient Position: Sitting, Cuff Size: Large)   Pulse 74   Ht _0  (1.753 m)   Wt 202 lb 6.4 oz (91.8 kg)   SpO2 97%   BMI 29.89 kg/m  Wt Readings from Last 3 Encounters:  04/01/22 202 lb 6.4 oz (91.8 kg)  10/30/21 199 lb 9.6 oz (90.5 kg)  08/04/21 199 lb 8 oz (90.5 kg)    No results found for: "TSH" Lab Results  Component Value Date   WBC 5.7 02/18/2021   HGB 12.7 02/18/2021   HCT 39.4 02/18/2021   MCV 83 02/18/2021   PLT 258 02/18/2021   Lab Results  Component Value Date   NA 139 03/04/2021   K 3.7 03/04/2021   CO2 26 03/04/2021   GLUCOSE 82 03/04/2021   BUN 15 03/04/2021   CREATININE 0.89 03/04/2021   BILITOT 0.3 02/18/2021   ALKPHOS 74 02/18/2021   AST 16 02/18/2021   ALT 14 02/18/2021   PROT 7.3 02/18/2021   ALBUMIN 4.6 02/18/2021   CALCIUM 9.8 03/04/2021   ANIONGAP 9 06/12/2020   EGFR 77 03/04/2021   Lab Results  Component Value Date   CHOL 214 (H) 02/18/2021   Lab Results  Component Value Date   HDL 47 02/18/2021   Lab Results  Component Value Date   LDLCALC 143 (H) 02/18/2021   Lab Results  Component Value Date   TRIG 132 02/18/2021   Lab Results  Component Value Date   CHOLHDL 4.6 (H) 02/18/2021   Lab Results  Component Value Date   HGBA1C 6.2 (H) 02/18/2021      Assessment & Plan:   Problem List Items Addressed This Visit       Cardiovascular and  Mediastinum   HTN (hypertension)    BP Readings from Last 1 Encounters:  04/01/22 128/76  Well-controlled with Amlodipine 5 mg QD and HCTZ 25 mg QD now Counseled for compliance with the medications Advised DASH diet and moderate exercise/walking, at least 150 mins/week      Relevant Medications   amLODipine (NORVASC) 5 MG tablet   Other Relevant Orders   TSH   CMP14+EGFR   CBC with Differential/Platelet     Nervous and Auditory   Impacted cerumen of both ears    B/l ear irrigation done Still has waxy material in the ear canal as she could not tolerate the pain from irrigation Has mild erythema in left ear canal, started Ofloxacin ear drops         Other   Hyperlipidemia    On Crestor now Check lipid profile      Relevant Medications   amLODipine (NORVASC) 5 MG tablet   Other Relevant Orders   Lipid panel   CMP14+EGFR   Vitamin D deficiency   Relevant Orders   VITAMIN D 25 Hydroxy (Vit-D Deficiency, Fractures)   Prediabetes    Lab Results  Component Value Date   HGBA1C 6.2 (H) 02/18/2021  Advised to follow low carb diet for now      Relevant Orders   Hemoglobin A1c   Chronic pain of both knees    With stiffness in the mornings Will be due to OA of knee Takes diclofenac for right  hand pain, can also help with pain of OA Needs to avoid daily dosing above oral NSAIDs as she has GERD, advised to alternate with Tylenol arthritis      Encounter for general adult medical examination with abnormal findings - Primary    Physical exam as documented. Fasting blood tests ordered.      Other Visit Diagnoses     Screening for thyroid disorder       Relevant Orders   TSH   Acute mucoid otitis media of left ear       Relevant Medications   ofloxacin (FLOXIN) 0.3 % OTIC solution       Meds ordered this encounter  Medications   ofloxacin (FLOXIN) 0.3 % OTIC solution    Sig: Place 5 drops into the left ear daily.    Dispense:  5 mL    Refill:  0   amLODipine  (NORVASC) 5 MG tablet    Sig: Take 1 tablet (5 mg total) by mouth daily.    Dispense:  90 tablet    Refill:  1    Follow-up: Return in about 6 months (around 10/01/2022) for HTN and HLD.    Lindell Spar, MD

## 2022-04-01 NOTE — Assessment & Plan Note (Signed)
Physical exam as documented. Fasting blood tests ordered. 

## 2022-04-01 NOTE — Patient Instructions (Addendum)
Please apply Ofloxacin ear drops in left ear as prescribed.  Please avoid taking Diclofenac on daily basis. Okay to alternate with Tylenol arthritis as needed for pain.  Please continue to follow low salt diet and perform moderate exercise/walking at least 150 mins/week.  Please get fasting blood tests done within a week.

## 2022-04-02 DIAGNOSIS — E782 Mixed hyperlipidemia: Secondary | ICD-10-CM | POA: Diagnosis not present

## 2022-04-02 DIAGNOSIS — E559 Vitamin D deficiency, unspecified: Secondary | ICD-10-CM | POA: Diagnosis not present

## 2022-04-02 DIAGNOSIS — R7303 Prediabetes: Secondary | ICD-10-CM | POA: Diagnosis not present

## 2022-04-02 DIAGNOSIS — I10 Essential (primary) hypertension: Secondary | ICD-10-CM | POA: Diagnosis not present

## 2022-04-02 DIAGNOSIS — Z1329 Encounter for screening for other suspected endocrine disorder: Secondary | ICD-10-CM | POA: Diagnosis not present

## 2022-04-03 LAB — LIPID PANEL
Chol/HDL Ratio: 3 ratio (ref 0.0–4.4)
Cholesterol, Total: 157 mg/dL (ref 100–199)
HDL: 53 mg/dL (ref >39)
LDL Chol Calc (NIH): 90 mg/dL (ref 0–99)
Triglycerides: 72 mg/dL (ref 0–149)
VLDL Cholesterol Cal: 14 mg/dL (ref 5–40)

## 2022-04-03 LAB — CBC WITH DIFFERENTIAL/PLATELET
Basophils Absolute: 0 10*3/uL (ref 0.0–0.2)
Basos: 0 %
EOS (ABSOLUTE): 0.1 10*3/uL (ref 0.0–0.4)
Eos: 1 %
Hematocrit: 35.6 % (ref 34.0–46.6)
Hemoglobin: 11.9 g/dL (ref 11.1–15.9)
Immature Grans (Abs): 0 10*3/uL (ref 0.0–0.1)
Immature Granulocytes: 0 %
Lymphocytes Absolute: 2 10*3/uL (ref 0.7–3.1)
Lymphs: 26 %
MCH: 27 pg (ref 26.6–33.0)
MCHC: 33.4 g/dL (ref 31.5–35.7)
MCV: 81 fL (ref 79–97)
Monocytes Absolute: 0.5 10*3/uL (ref 0.1–0.9)
Monocytes: 7 %
Neutrophils Absolute: 4.9 10*3/uL (ref 1.4–7.0)
Neutrophils: 66 %
Platelets: 232 10*3/uL (ref 150–450)
RBC: 4.4 x10E6/uL (ref 3.77–5.28)
RDW: 13.3 % (ref 11.7–15.4)
WBC: 7.4 10*3/uL (ref 3.4–10.8)

## 2022-04-03 LAB — CMP14+EGFR
ALT: 18 IU/L (ref 0–32)
AST: 22 IU/L (ref 0–40)
Albumin/Globulin Ratio: 1.5 (ref 1.2–2.2)
Albumin: 4.4 g/dL (ref 3.8–4.9)
Alkaline Phosphatase: 72 IU/L (ref 44–121)
BUN/Creatinine Ratio: 18 (ref 9–23)
BUN: 17 mg/dL (ref 6–24)
Bilirubin Total: 0.5 mg/dL (ref 0.0–1.2)
CO2: 22 mmol/L (ref 20–29)
Calcium: 9.6 mg/dL (ref 8.7–10.2)
Chloride: 100 mmol/L (ref 96–106)
Creatinine, Ser: 0.95 mg/dL (ref 0.57–1.00)
Globulin, Total: 3 g/dL (ref 1.5–4.5)
Glucose: 105 mg/dL — ABNORMAL HIGH (ref 70–99)
Potassium: 3.6 mmol/L (ref 3.5–5.2)
Sodium: 138 mmol/L (ref 134–144)
Total Protein: 7.4 g/dL (ref 6.0–8.5)
eGFR: 71 mL/min/{1.73_m2} (ref >59)

## 2022-04-03 LAB — VITAMIN D 25 HYDROXY (VIT D DEFICIENCY, FRACTURES): Vit D, 25-Hydroxy: 51.2 ng/mL (ref 30.0–100.0)

## 2022-04-03 LAB — HEMOGLOBIN A1C
Est. average glucose Bld gHb Est-mCnc: 134 mg/dL
Hgb A1c MFr Bld: 6.3 % — ABNORMAL HIGH (ref 4.8–5.6)

## 2022-04-03 LAB — TSH: TSH: 0.723 u[IU]/mL (ref 0.450–4.500)

## 2022-05-05 IMAGING — CT CT HEART MORP W/ CTA COR W/ SCORE W/ CA W/CM &/OR W/O CM
4 of 7 series · 8 of 20 positions shown, 9 images · IV contrast (APPLIED)
Comparison: None.
COMPARISON: None.

Addendum:
EXAM:
OVER-READ INTERPRETATION  CT CHEST

The following report is an over-read performed by radiologist Dr.
Lenis Upegui [REDACTED] on 03/21/2021. This
over-read does not include interpretation of cardiac or coronary
anatomy or pathology. The coronary calcium score/coronary CTA
interpretation by the cardiologist is attached.
CLINICAL DATA: 55 yo female with chest pain
Cardiac/Coronary CTA
TECHNIQUE: A non-contrast, gated CT scan was obtained with axial slices of 3 mm
through the heart for calcium scoring. Calcium scoring was performed
using the Agatston method. A 120 kV prospective, gated, contrast
cardiac scan was obtained. Gantry rotation speed was 250 msecs and
collimation was 0.6 mm. Two sublingual nitroglycerin tablets (0.8
mg) were given. The 3D data set was reconstructed in 5% intervals of
the 35-75% of the R-R cycle. Diastolic phases were analyzed on a
dedicated workstation using MPR, MIP, and VRT modes. The patient
received 95 cc of contrast.

[Series 6: best diast 75 % · axial · 0.39mm/px · z∈[-121,-84]mm · 2 of 278 slices shown]
[im 93/278  vessel]
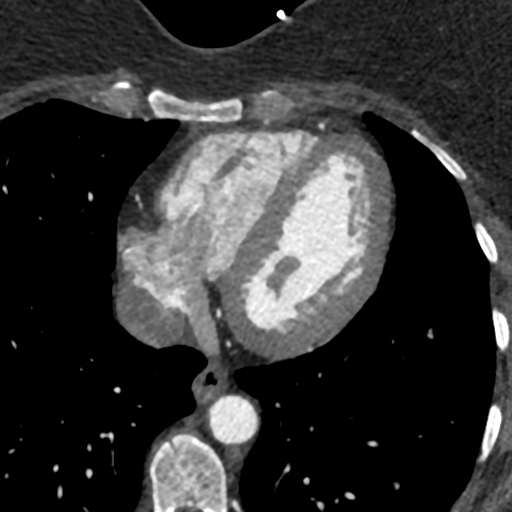
[im 185/278  vessel]
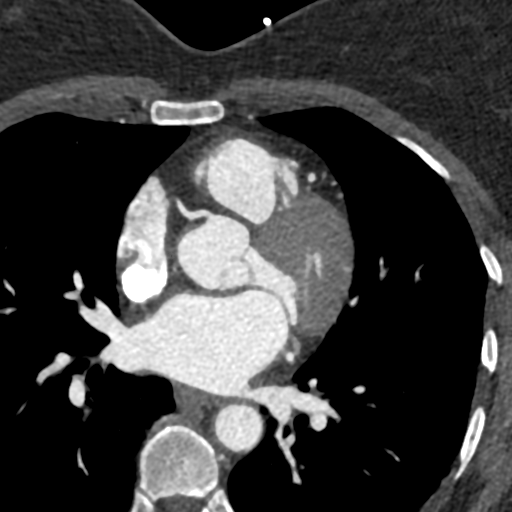

[Series 7: ts diast sharp · axial · 0.39mm/px · z∈[-121,-84]mm · 2 of 278 slices shown]
[im 93/278  lung]
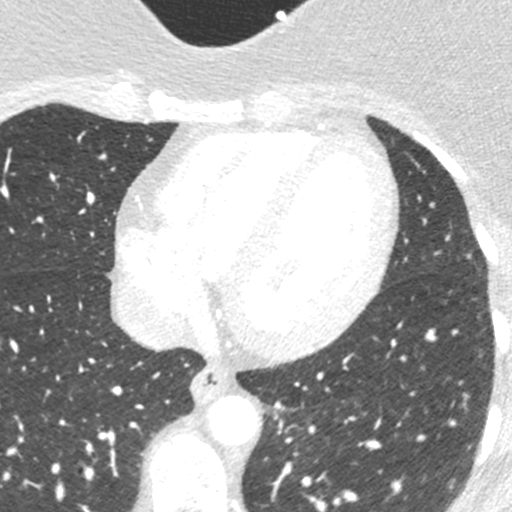
[im 185/278  lung]
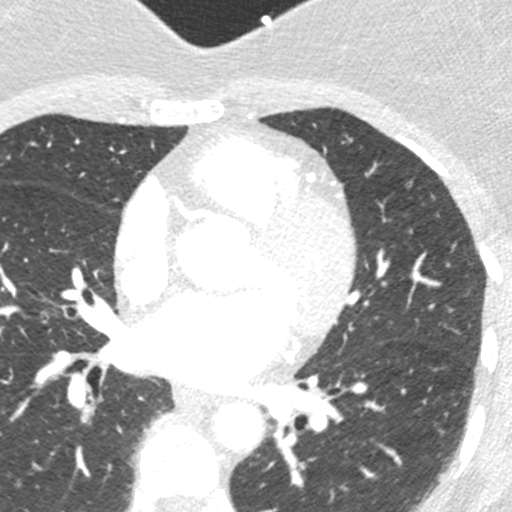

[Series 8: best syst · axial · 0.39mm/px · z∈[-121,-84]mm · 2 of 278 slices shown, 3 images]
[im 93/278  vessel]
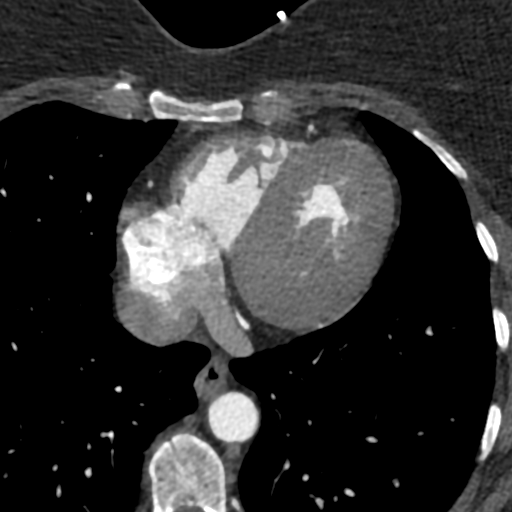
[im 93/278  lung]
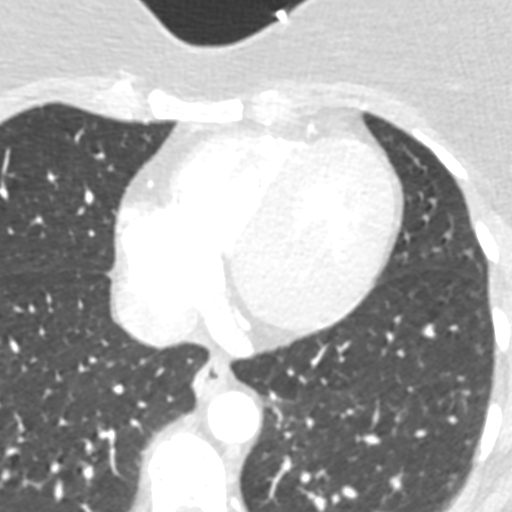
[im 185/278  vessel]
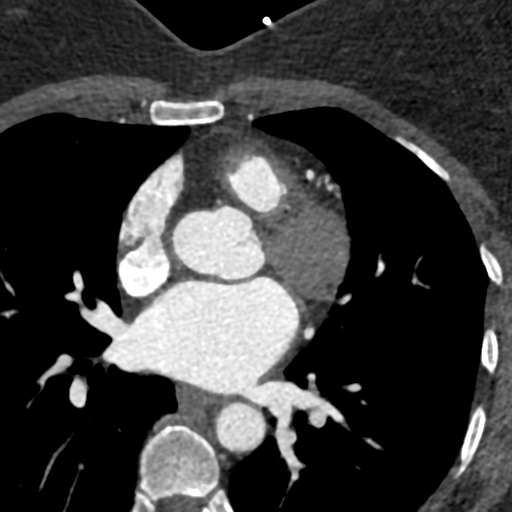

[Series 9: ts syst sharp · axial · 0.39mm/px · z∈[-121,-84]mm · 2 of 278 slices shown]
[im 93/278  lung]
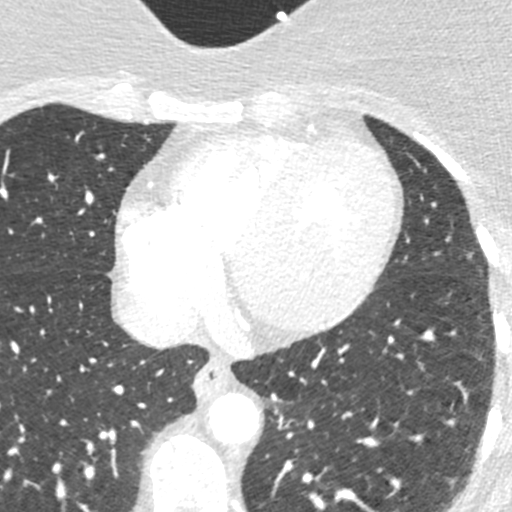
[im 185/278  lung]
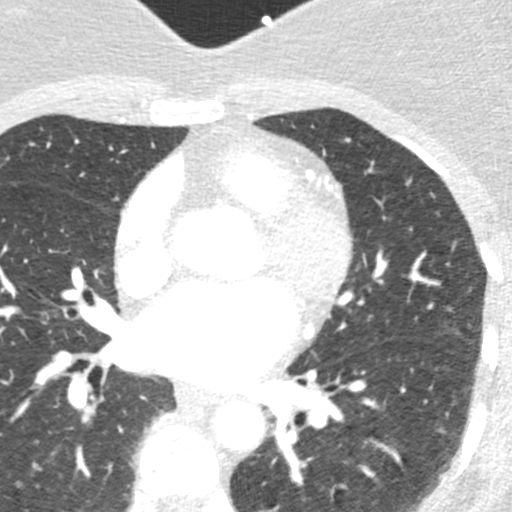

[8 of 20 positions shown; findings below may reference images not displayed]

FINDINGS: Within the visualized portions of the thorax there are no suspicious
appearing pulmonary nodules or masses, there is no acute
consolidative airspace disease, no pleural effusions, no
pneumothorax and no lymphadenopathy. Visualized portions of the
upper abdomen are unremarkable. There are no aggressive appearing
lytic or blastic lesions noted in the visualized portions of the
skeleton.
IMPRESSION: 1. No significant incidental noncardiac findings are noted.
FINDINGS: Image quality: Average.

Noise artifact is: Limited

Coronary Arteries:  Normal coronary origin.  Left dominance.

Left main: The left main is a large caliber vessel with a normal
take off from the left coronary cusp that bifurcates to form a left
anterior descending artery and a left circumflex artery. There is no
plaque or stenosis.

Left anterior descending artery: The LAD is patent without evidence
of plaque or stenosis. The LAD gives off 2 patent diagonal branches.

Left circumflex artery: The LCX is dominant and patent with no
evidence of plaque or stenosis. The LCX gives off small OM1 and OM2;
patent OM3; terminates as left posterolateral and PDA.

Right coronary artery: The RCA is non-dominant with normal take off
from the right coronary cusp. There is no evidence of plaque or
stenosis. The RCA terminates as a RV branch.

Right Atrium: Right atrial size is within normal limits.

Right Ventricle: The right ventricular cavity is within normal
limits.

Left Atrium: Left atrial size is normal in size with no left atrial
appendage filling defect.

Left Ventricle: The ventricular cavity size is within normal limits.
There are no stigmata of prior infarction. There is no abnormal
filling defect.

Pulmonary arteries: Normal in size without proximal filling defect.

Pulmonary veins: Normal pulmonary venous drainage.

Pericardium: Normal thickness with no significant effusion or
calcium present.

Cardiac valves: The aortic valve is trileaflet without significant
calcification. The mitral valve is normal structure without
significant calcification.

Aorta: Normal caliber with no significant disease.

Extra-cardiac findings: See attached radiology report for
non-cardiac structures.
IMPRESSION: 1. Coronary calcium score of 0. This was 0 percentile for age-, sex,
and race-matched controls.

2. Normal coronary origin with left dominance.

3. No evidence of CAD.

RECOMMENDATIONS:
CAD-RADS 0: No evidence of CAD (0%). Consider non-atherosclerotic
causes of chest pain.

*** End of Addendum ***
EXAM:
OVER-READ INTERPRETATION  CT CHEST

The following report is an over-read performed by radiologist Dr.
Lenis Upegui [REDACTED] on 03/21/2021. This
over-read does not include interpretation of cardiac or coronary
anatomy or pathology. The coronary calcium score/coronary CTA
interpretation by the cardiologist is attached.
FINDINGS: Within the visualized portions of the thorax there are no suspicious
appearing pulmonary nodules or masses, there is no acute
consolidative airspace disease, no pleural effusions, no
pneumothorax and no lymphadenopathy. Visualized portions of the
upper abdomen are unremarkable. There are no aggressive appearing
lytic or blastic lesions noted in the visualized portions of the
skeleton.
IMPRESSION: 1. No significant incidental noncardiac findings are noted.

## 2022-05-09 ENCOUNTER — Other Ambulatory Visit: Payer: Self-pay | Admitting: Internal Medicine

## 2022-05-09 DIAGNOSIS — I10 Essential (primary) hypertension: Secondary | ICD-10-CM

## 2022-05-16 ENCOUNTER — Other Ambulatory Visit: Payer: Self-pay | Admitting: Internal Medicine

## 2022-05-16 DIAGNOSIS — K219 Gastro-esophageal reflux disease without esophagitis: Secondary | ICD-10-CM

## 2022-05-24 ENCOUNTER — Encounter (HOSPITAL_COMMUNITY): Payer: Self-pay

## 2022-05-24 ENCOUNTER — Other Ambulatory Visit: Payer: Self-pay

## 2022-05-24 ENCOUNTER — Emergency Department (HOSPITAL_COMMUNITY)
Admission: EM | Admit: 2022-05-24 | Discharge: 2022-05-24 | Disposition: A | Discharge status: Home / Self Care | Payer: BC Managed Care – PPO | Attending: Emergency Medicine | Admitting: Emergency Medicine

## 2022-05-24 DIAGNOSIS — Z7982 Long term (current) use of aspirin: Secondary | ICD-10-CM | POA: Insufficient documentation

## 2022-05-24 DIAGNOSIS — Z79899 Other long term (current) drug therapy: Secondary | ICD-10-CM | POA: Insufficient documentation

## 2022-05-24 DIAGNOSIS — I10 Essential (primary) hypertension: Secondary | ICD-10-CM | POA: Insufficient documentation

## 2022-05-24 DIAGNOSIS — R059 Cough, unspecified: Secondary | ICD-10-CM | POA: Diagnosis not present

## 2022-05-24 DIAGNOSIS — U071 COVID-19: Secondary | ICD-10-CM | POA: Insufficient documentation

## 2022-05-24 LAB — GROUP A STREP BY PCR: Group A Strep by PCR: NOT DETECTED

## 2022-05-24 LAB — RESP PANEL BY RT-PCR (RSV, FLU A&B, COVID)  RVPGX2
Influenza A by PCR: NEGATIVE
Influenza B by PCR: NEGATIVE
Resp Syncytial Virus by PCR: NEGATIVE
SARS Coronavirus 2 by RT PCR: POSITIVE — AB

## 2022-05-24 MED ORDER — AEROCHAMBER PLUS FLO-VU MEDIUM MISC
1.0000 | Freq: Once | Status: AC
  Administered 2022-05-24: 1
  Filled 2022-05-24: qty 10
  Filled 2022-05-24: qty 1

## 2022-05-24 MED ORDER — PAXLOVID (300/100) 20 X 150 MG & 10 X 100MG PO TBPK: 3.0000 | ORAL_TABLET | Freq: Two times a day (BID) | ORAL | 0 refills | Status: AC

## 2022-05-24 MED ORDER — ALBUTEROL SULFATE HFA 108 (90 BASE) MCG/ACT IN AERS
1.0000 | INHALATION_SPRAY | RESPIRATORY_TRACT | Status: DC | PRN
  Administered 2022-05-24: 2 via RESPIRATORY_TRACT
  Filled 2022-05-24: qty 6.7

## 2022-05-24 MED ORDER — KETOROLAC TROMETHAMINE 30 MG/ML IJ SOLN
30.0000 mg | Freq: Once | INTRAMUSCULAR | Status: AC
  Administered 2022-05-24: 30 mg via INTRAMUSCULAR
  Filled 2022-05-24: qty 1

## 2022-05-24 MED ORDER — DEXAMETHASONE SODIUM PHOSPHATE 10 MG/ML IJ SOLN
10.0000 mg | Freq: Once | INTRAMUSCULAR | Status: AC
  Administered 2022-05-24: 10 mg via INTRAMUSCULAR
  Filled 2022-05-24: qty 1

## 2022-05-24 NOTE — ED Triage Notes (Signed)
Pt c/o cough that is productive, sore throat that started Friday, denies any fever, n/v/d.

## 2022-05-24 NOTE — ED Provider Notes (Signed)
[facility] Provider Note   CSN: [id number] Arrival date & time: 05/24/22  1433     History  Chief Complaint  Patient presents with   Cough    Tara Wolf is a 57 y.o. female.  Pt is a 57 yo female with pmhx significant for gerd, ibs, arthritis and htn.  Pt said she has had cough and sore throat since 2/9.  She denies any fever.  No known sick requirements.        Home Medications Prior to Admission medications   Medication Sig Start Date End Date Taking? Authorizing Provider  nirmatrelvir & ritonavir (PAXLOVID, 300/100,) 20 x 150 MG & 10 x 100MG TBPK Take 3 tablets by mouth 2 (two) times daily for 5 days. 05/24/22 05/29/22 Yes Isla Pence, MD  acetaminophen (TYLENOL) 325 MG tablet Take 650 mg by mouth every 6 (six) hours as needed for mild pain, fever or headache.    [provider]  amLODipine (NORVASC) 5 MG tablet TAKE 1 TABLET(5 MG) BY MOUTH DAILY 05/11/22   Lindell Spar, MD  ASPIRIN 81 PO Take by mouth daily.    [provider]  diclofenac (VOLTAREN) 75 MG EC tablet TAKE 1 TABLET(75 MG) BY MOUTH TWICE DAILY WITH A MEAL 03/24/22   Sanjuana Kava, MD  hydrochlorothiazide (HYDRODIURIL) 25 MG tablet Take 1 tablet (25 mg total) by mouth daily. 10/30/21   Lindell Spar, MD  nitroGLYCERIN (NITROSTAT) 0.4 MG SL tablet Place 1 tablet (0.4 mg total) under the tongue every 5 (five) minutes as needed for chest pain. 03/04/21 07/08/21  Tobb, Kardie, DO  ofloxacin (FLOXIN) 0.3 % OTIC solution Place 5 drops into the left ear daily. 04/01/22   Lindell Spar, MD  omeprazole (PRILOSEC) 20 MG capsule TAKE 1 CAPSULE(20 MG) BY MOUTH DAILY 05/18/22   Lindell Spar, MD  rosuvastatin (CRESTOR) 5 MG tablet TAKE 1 TABLET(5 MG) BY MOUTH DAILY 02/17/22   Lindell Spar, MD      Allergies    Patient has no known allergies.    Review of Systems   Review of Systems  HENT:  Positive for sore throat.   Respiratory:   Positive for cough.   All other systems reviewed and are negative.   Physical Exam Updated Vital Signs BP 135/77   Pulse 73   Temp 99.3 F (37.4 C) (Oral)   Resp 18   Ht 5' 9"$  (1.753 m)   Wt 93.4 kg   SpO2 100%   BMI 30.42 kg/m  Physical Exam Vitals and nursing note reviewed.  Constitutional:      Appearance: Normal appearance.  HENT:     Head: Normocephalic and atraumatic.     Right Ear: External ear normal.     Left Ear: External ear normal.     Nose: Nose normal.     Mouth/Throat:     Mouth: Mucous membranes are moist.     Pharynx: Oropharynx is clear.  Eyes:     Extraocular Movements: Extraocular movements intact.     Conjunctiva/sclera: Conjunctivae normal.     Pupils: Pupils are equal, round, and reactive to light.  Cardiovascular:     Rate and Rhythm: Normal rate and regular rhythm.     Pulses: Normal pulses.     Heart sounds: Normal heart sounds.  Pulmonary:     Effort: Pulmonary effort is normal.     Breath sounds: Normal breath sounds.  Abdominal:  General: Abdomen is flat. Bowel sounds are normal.     Palpations: Abdomen is soft.  Musculoskeletal:        General: Normal range of motion.     Cervical back: Normal range of motion and neck supple.  Skin:    General: Skin is warm.     Capillary Refill: Capillary refill takes less than 2 seconds.  Neurological:     General: No focal deficit present.     Mental Status: She is alert and oriented to person, place, and time.  Psychiatric:        Mood and Affect: Mood normal.        Behavior: Behavior normal.     ED Results / Procedures / Treatments   Labs (all labs ordered are listed, but only abnormal results are displayed) Labs Reviewed  RESP PANEL BY RT-PCR (RSV, FLU A&B, COVID)  RVPGX2 - Abnormal; Notable for the following components:      Result Value   SARS Coronavirus 2 by RT PCR POSITIVE (*)    All other components within normal limits  GROUP A STREP BY PCR    EKG None  Radiology No  results found.  Procedures Procedures    Medications Ordered in ED Medications  albuterol (VENTOLIN HFA) 108 (90 Base) MCG/ACT inhaler 1-2 puff (2 puffs Inhalation Given 05/24/22 1542)  ketorolac (TORADOL) 30 MG/ML injection 30 mg (30 mg Intramuscular Given 05/24/22 1527)  dexamethasone (DECADRON) injection 10 mg (10 mg Intramuscular Given 05/24/22 1527)  AeroChamber Plus Flo-Vu Medium MISC 1 each (1 each Other Given 05/24/22 1542)    ED Course/ Medical Decision Making/ A&P                             Medical Decision Making Risk Prescription drug management.   This patient presents to the ED for concern of cough, this involves an extensive number of treatment options, and is a complaint that carries with it a high risk of complications and morbidity.  The differential diagnosis includes covid/flu/rsv, bronchitis, pna   Co morbidities that complicate the patient evaluation  gerd, ibs, arthritis and htn   Additional history obtained:  Additional history obtained from epic chart review    Lab Tests:  I Ordered, and personally interpreted labs.  The pertinent results include:  covid +; flu/rsv neg; strep neg  Medicines ordered and prescription drug management:  I ordered medication including decadron and toradol  for sx  Reevaluation of the patient after these medicines showed that the patient improved I have reviewed the patients home medicines and have made adjustments as needed   Problem List / ED Course:  Covid-19:  pt's kidney function is normal, so paxlovid is prescribed.  Pt is not hypoxic.  She is stable for d/c.  Return if worse.  F/u with pcp.   Reevaluation:  After the interventions noted above, I reevaluated the patient and found that they have :improved   Social Determinants of Health:  Lives at home   Dispostion:  After consideration of the diagnostic results and the patients response to treatment, I feel that the patent would benefit from  discharge with outpatient f/u.    Tara Wolf was evaluated in Emergency Department on 05/24/2022 for the symptoms described in the history of present illness. She was evaluated in the context of the global COVID-19 pandemic, which necessitated consideration that the patient might be at risk for infection with the SARS-CoV-2 virus  that causes COVID-19. Institutional protocols and algorithms that pertain to the evaluation of patients at risk for COVID-19 are in a state of rapid change based on information released by regulatory bodies including the CDC and federal and state organizations. These policies and algorithms were followed during the patient's care in the ED.         Final Clinical Impression(s) / ED Diagnoses Final diagnoses:  [id number]    Rx / DC Orders ED Discharge Orders          Ordered    nirmatrelvir & ritonavir (PAXLOVID, 300/100,) 20 x 150 MG & 10 x 100MG TBPK  2 times daily        05/24/22 1641              Isla Pence, MD 05/24/22 1643

## 2022-06-01 ENCOUNTER — Other Ambulatory Visit (HOSPITAL_COMMUNITY): Payer: Self-pay | Admitting: Internal Medicine

## 2022-06-01 ENCOUNTER — Encounter: Payer: Self-pay | Admitting: Internal Medicine

## 2022-06-01 ENCOUNTER — Ambulatory Visit: Payer: BC Managed Care – PPO | Admitting: Internal Medicine

## 2022-06-01 VITALS — BP 136/75 | HR 82 | Ht 69.0 in | Wt 204.8 lb

## 2022-06-01 DIAGNOSIS — U071 COVID-19: Secondary | ICD-10-CM | POA: Diagnosis not present

## 2022-06-01 DIAGNOSIS — F5101 Primary insomnia: Secondary | ICD-10-CM | POA: Diagnosis not present

## 2022-06-01 DIAGNOSIS — N6489 Other specified disorders of breast: Secondary | ICD-10-CM

## 2022-06-01 DIAGNOSIS — Z09 Encounter for follow-up examination after completed treatment for conditions other than malignant neoplasm: Secondary | ICD-10-CM

## 2022-06-01 NOTE — Patient Instructions (Signed)
Please continue taking medications as prescribed.  Please maintain simple sleep hygiene. - Maintain dark and non-noisy environment in the bedroom. - Please use the bedroom for sleep and sexual activity only. - Do not use electronic devices in the bedroom. - Please take dinner at least 2 hours before bedtime. - Please avoid caffeinated products in the evening, including coffee, soft drinks. - Please try to maintain the regular sleep-wake cycle - Go to bed and wake up at the same time. 

## 2022-06-01 NOTE — Assessment & Plan Note (Signed)
Sleep hygiene material provided Can try Melatonin for insomnia If persistent, can try Elavil considering her myalgias

## 2022-06-01 NOTE — Assessment & Plan Note (Signed)
Recent ER visit for COVID infection ER chart reviewed, including imaging Completed Paxlovid Symptoms improved Has chronic myalgia and insomnia

## 2022-06-01 NOTE — Progress Notes (Signed)
Established Patient Office Visit  Subjective:  Patient ID: Tara Wolf, female    DOB: July 19, 1965  Age: 57 y.o. MRN: [id number]  CC:  Chief Complaint  Patient presents with   covid follow up    Patient is following up from having covid    HPI Tara Wolf is a 57 y.o. female with past medical history of HTN, GERD, IBS-C and HLD who presents for f/u of recent ER visit.  She went to ER on 02/11 for cough and fatigue.  She was given Paxlovid and IM Decadron for COVID infection.  She denies any fever, chills, dyspnea or wheezing currently.  Her chest x-ray was negative for acute pathology.  She reports chronic insomnia. She gets off from work at 11 PM and has to wake up at 5:55 AM for her grandson's school. She tries to take nap in the afternoon, but has had difficulty falling asleep.  Past Medical History:  Diagnosis Date   Arthritis    Chronic constipation    GERD (gastroesophageal reflux disease)    H/O: hysterectomy 07/10/2020   IBS (irritable bowel syndrome)    Nausea and vomiting    chronic   Syncope 05/19/2017    Past Surgical History:  Procedure Laterality Date   ABDOMINAL HYSTERECTOMY     BREAST BIOPSY Left    neg   COLONOSCOPY N/A 06/23/2018   Procedure: COLONOSCOPY;  Surgeon: Danie Binder, MD;  Location: [facility];  Service: Endoscopy;  Laterality: N/A;  1:00pm   COLONOSCOPY WITH ESOPHAGOGASTRODUODENOSCOPY (EGD)  03/2009   Dr. Oneida Alar: Tortuous colon.  Otherwise normal colon.  Moderate sized sliding hiatal hernia.  Multiple erosions in the antrum.  Small bowel biopsies negative for celiac disease.   POLYPECTOMY  06/23/2018   Procedure: POLYPECTOMY;  Surgeon: Danie Binder, MD;  Location: [facility];  Service: Endoscopy;;  transverse colon polyp    Family History  Problem Relation Age of Onset   Breast cancer Cousin    Hypertension Mother    Cancer Father        colon cancer, age 41   Cancer Sister        patient doesn't know what  kind   Cancer Brother        not sure primary    Social History   Socioeconomic History   Marital status: Single    Spouse name: Not on file   Number of children: Not on file   Years of education: Not on file   Highest education level: Not on file  Occupational History   Not on file  Tobacco Use   Smoking status: Never   Smokeless tobacco: Never  Vaping Use   Vaping Use: Never used  Substance and Sexual Activity   Alcohol use: Yes    Comment: ocassional   Drug use: No   Sexual activity: Not on file  Other Topics Concern   Not on file  Social History Narrative   Not on file   Social Determinants of Health   Financial Resource Strain: Not on file  Food Insecurity: Not on file  Transportation Needs: Not on file  Physical Activity: Not on file  Stress: Not on file  Social Connections: Not on file  Intimate Partner Violence: Not on file    Outpatient Medications Prior to Visit  Medication Sig Dispense Refill   acetaminophen (TYLENOL) 325 MG tablet Take 650 mg by mouth every 6 (six) hours as needed for mild pain, fever or  headache.     amLODipine (NORVASC) 5 MG tablet TAKE 1 TABLET(5 MG) BY MOUTH DAILY 90 tablet 0   ASPIRIN 81 PO Take by mouth daily.     diclofenac (VOLTAREN) 75 MG EC tablet TAKE 1 TABLET(75 MG) BY MOUTH TWICE DAILY WITH A MEAL 60 tablet 5   hydrochlorothiazide (HYDRODIURIL) 25 MG tablet Take 1 tablet (25 mg total) by mouth daily. 90 tablet 3   nitroGLYCERIN (NITROSTAT) 0.4 MG SL tablet Place 1 tablet (0.4 mg total) under the tongue every 5 (five) minutes as needed for chest pain. 90 tablet 3   omeprazole (PRILOSEC) 20 MG capsule TAKE 1 CAPSULE(20 MG) BY MOUTH DAILY 90 capsule 3   rosuvastatin (CRESTOR) 5 MG tablet TAKE 1 TABLET(5 MG) BY MOUTH DAILY 90 tablet 1   ofloxacin (FLOXIN) 0.3 % OTIC solution Place 5 drops into the left ear daily. 5 mL 0   No facility-administered medications prior to visit.    No Known Allergies  ROS Review of Systems   Constitutional:  Negative for chills and fever.  HENT:  Negative for congestion, ear discharge, sinus pressure, sinus pain and sore throat.   Eyes:  Negative for pain and discharge.  Respiratory:  Negative for cough and shortness of breath.   Cardiovascular:  Positive for leg swelling. Negative for palpitations.  Gastrointestinal:  Positive for constipation. Negative for abdominal pain, diarrhea, nausea and vomiting.  Endocrine: Negative for polydipsia and polyuria.  Genitourinary:  Negative for dysuria and hematuria.  Musculoskeletal:  Positive for arthralgias and myalgias. Negative for neck pain and neck stiffness.  Skin:  Negative for rash.  Neurological:  Positive for numbness (B/l hands). Negative for dizziness and weakness.  Psychiatric/Behavioral:  Positive for sleep disturbance. Negative for agitation and behavioral problems.       Objective:    Physical Exam Vitals reviewed.  Constitutional:      General: She is not in acute distress.    Appearance: She is not diaphoretic.  HENT:     Head: Normocephalic and atraumatic.     Nose: Nose normal.     Mouth/Throat:     Mouth: Mucous membranes are moist.     Pharynx: No posterior oropharyngeal erythema.  Eyes:     General: No scleral icterus.    Extraocular Movements: Extraocular movements intact.  Cardiovascular:     Rate and Rhythm: Normal rate and regular rhythm.     Pulses: Normal pulses.     Heart sounds: Normal heart sounds. No murmur heard. Pulmonary:     Breath sounds: Normal breath sounds. No wheezing or rales.  Musculoskeletal:     Cervical back: Neck supple. No tenderness.     Right lower leg: Edema (Mild) present.     Left lower leg: Edema (Mild) present.  Skin:    General: Skin is warm.     Findings: No rash.  Neurological:     General: No focal deficit present.     Mental Status: She is alert and oriented to person, place, and time.  Psychiatric:        Mood and Affect: Mood normal.        Behavior:  Behavior normal.     BP 136/75 (BP Location: Right Arm, Patient Position: Sitting, Cuff Size: Normal)   Pulse 82   Ht 5' 9"$  (1.753 m)   Wt 204 lb 12.8 oz (92.9 kg)   SpO2 95%   BMI 30.24 kg/m  Wt Readings from Last 3 Encounters:  06/01/22 204 lb  12.8 oz (92.9 kg)  05/24/22 206 lb (93.4 kg)  04/01/22 202 lb 6.4 oz (91.8 kg)    Lab Results  Component Value Date   TSH 0.723 04/02/2022   Lab Results  Component Value Date   WBC 7.4 04/02/2022   HGB 11.9 04/02/2022   HCT 35.6 04/02/2022   MCV 81 04/02/2022   PLT 232 04/02/2022   Lab Results  Component Value Date   NA 138 04/02/2022   K 3.6 04/02/2022   CO2 22 04/02/2022   GLUCOSE 105 (H) 04/02/2022   BUN 17 04/02/2022   CREATININE 0.95 04/02/2022   BILITOT 0.5 04/02/2022   ALKPHOS 72 04/02/2022   AST 22 04/02/2022   ALT 18 04/02/2022   PROT 7.4 04/02/2022   ALBUMIN 4.4 04/02/2022   CALCIUM 9.6 04/02/2022   ANIONGAP 9 06/12/2020   EGFR 71 04/02/2022   Lab Results  Component Value Date   CHOL 157 04/02/2022   Lab Results  Component Value Date   HDL 53 04/02/2022   Lab Results  Component Value Date   LDLCALC 90 04/02/2022   Lab Results  Component Value Date   TRIG 72 04/02/2022   Lab Results  Component Value Date   CHOLHDL 3.0 04/02/2022   Lab Results  Component Value Date   HGBA1C 6.3 (H) 04/02/2022      Assessment & Plan:   Problem List Items Addressed This Visit       Other   Encounter for examination following treatment at hospital - Primary    Recent ER visit for COVID infection ER chart reviewed, including imaging Completed Paxlovid Symptoms improved Has chronic myalgia and insomnia      Primary insomnia    Sleep hygiene material provided Can try Melatonin for insomnia If persistent, can try Elavil considering her myalgias      Other Visit Diagnoses     COVID-19           No orders of the defined types were placed in this encounter.   Follow-up: Return if symptoms  worsen or fail to improve.    Lindell Spar, MD

## 2022-06-11 ENCOUNTER — Encounter: Payer: Self-pay | Admitting: Radiology

## 2022-06-18 ENCOUNTER — Ambulatory Visit (HOSPITAL_COMMUNITY)
Admission: RE | Admit: 2022-06-18 | Discharge: 2022-06-18 | Disposition: A | Discharge status: Home / Self Care | Payer: BC Managed Care – PPO | Source: Ambulatory Visit | Attending: Internal Medicine | Admitting: Internal Medicine

## 2022-06-18 ENCOUNTER — Encounter (HOSPITAL_COMMUNITY): Payer: Self-pay

## 2022-06-18 DIAGNOSIS — N6489 Other specified disorders of breast: Secondary | ICD-10-CM

## 2022-06-18 DIAGNOSIS — R928 Other abnormal and inconclusive findings on diagnostic imaging of breast: Secondary | ICD-10-CM | POA: Diagnosis not present

## 2022-07-19 DIAGNOSIS — R030 Elevated blood-pressure reading, without diagnosis of hypertension: Secondary | ICD-10-CM | POA: Diagnosis not present

## 2022-07-19 DIAGNOSIS — L03115 Cellulitis of right lower limb: Secondary | ICD-10-CM | POA: Diagnosis not present

## 2022-08-31 DIAGNOSIS — J22 Unspecified acute lower respiratory infection: Secondary | ICD-10-CM | POA: Diagnosis not present

## 2022-09-12 ENCOUNTER — Other Ambulatory Visit: Payer: Self-pay | Admitting: Internal Medicine

## 2022-09-12 DIAGNOSIS — E782 Mixed hyperlipidemia: Secondary | ICD-10-CM

## 2022-10-08 ENCOUNTER — Ambulatory Visit: Payer: BC Managed Care – PPO | Admitting: Internal Medicine

## 2022-10-08 ENCOUNTER — Encounter: Payer: Self-pay | Admitting: Internal Medicine

## 2022-10-08 VITALS — BP 126/82 | HR 66 | Ht 69.0 in | Wt 205.4 lb

## 2022-10-08 DIAGNOSIS — I10 Essential (primary) hypertension: Secondary | ICD-10-CM

## 2022-10-08 DIAGNOSIS — R232 Flushing: Secondary | ICD-10-CM | POA: Insufficient documentation

## 2022-10-08 DIAGNOSIS — Z114 Encounter for screening for human immunodeficiency virus [HIV]: Secondary | ICD-10-CM

## 2022-10-08 DIAGNOSIS — R7303 Prediabetes: Secondary | ICD-10-CM

## 2022-10-08 DIAGNOSIS — K219 Gastro-esophageal reflux disease without esophagitis: Secondary | ICD-10-CM | POA: Diagnosis not present

## 2022-10-08 DIAGNOSIS — M79671 Pain in right foot: Secondary | ICD-10-CM | POA: Insufficient documentation

## 2022-10-08 DIAGNOSIS — E782 Mixed hyperlipidemia: Secondary | ICD-10-CM | POA: Diagnosis not present

## 2022-10-08 MED ORDER — HYDROCHLOROTHIAZIDE 25 MG PO TABS: 25.0000 mg | ORAL_TABLET | Freq: Every day | ORAL | 3 refills | Status: DC

## 2022-10-08 MED ORDER — PAROXETINE HCL 10 MG PO TABS: 10.0000 mg | ORAL_TABLET | Freq: Every day | ORAL | 3 refills | Status: DC

## 2022-10-08 MED ORDER — OMEPRAZOLE 40 MG PO CPDR: 40.0000 mg | DELAYED_RELEASE_CAPSULE | Freq: Every day | ORAL | 1 refills | Status: DC

## 2022-10-08 MED ORDER — AMLODIPINE BESYLATE 5 MG PO TABS: 5.0000 mg | ORAL_TABLET | Freq: Every day | ORAL | 1 refills | Status: DC

## 2022-10-08 NOTE — Assessment & Plan Note (Signed)
Uncontrolled with Omeprazole 20 mg every day Increased dose of omeprazole to 40 mg every day Needs to avoid hot and spicy food Advised to maintain at least 2 hours between last meal of the day and bedtime

## 2022-10-08 NOTE — Progress Notes (Signed)
Established Patient Office Visit  Subjective:  Patient ID: Tara Wolf, female    DOB: 09-Sep-1965  Age: 57 y.o. MRN: [id number]  CC:  Chief Complaint  Patient presents with   Hyperlipidemia    Six month follow up    Hypertension    Six month follow up    Gastroesophageal Reflux    Patient states her over the counter acid reflux medication is not helping    HPI Tara Wolf is a 57 y.o. female with past medical history of HTN, GERD, IBS-C and HLD who presents for f/u of her chronic medical conditions.  HTN: BP is well-controlled now. Takes medications regularly. Patient denies headache, dizziness, chest pain, dyspnea or palpitations.  Her leg swelling has improved with HCTZ now.  GERD: She has persistent acid reflux despite taking OTC omeprazole.  It keeps her awake at nighttime.  Denies any nausea or vomiting currently.  Denies any dysphagia or odynophagia.  Hot flashes: She reports severe hot flashes, almost on a daily basis.  She has had menopause for many years.  Does not not report vaginal dryness, itching or discharge currently.  Right foot pain: She had stepped on a nail about 2 weeks ago, and had bleeding from the right foot sole.  Site appears clean today, without any bleeding or discharge.  She has mild tenderness on the side.  She complains of pain over dorsal aspect of her foot, which is likely unrelated to the injury from the nail.  Pain is intermittent, worse with standing and better with rest.    Past Medical History:  Diagnosis Date   Arthritis    Chronic constipation    GERD (gastroesophageal reflux disease)    H/O: hysterectomy 07/10/2020   IBS (irritable bowel syndrome)    Nausea and vomiting    chronic   Syncope 05/19/2017    Past Surgical History:  Procedure Laterality Date   ABDOMINAL HYSTERECTOMY     BREAST BIOPSY Left    neg   COLONOSCOPY N/A 06/23/2018   Procedure: COLONOSCOPY;  Surgeon: West Bali, MD;  Location: [facility];  Service: Endoscopy;  Laterality: N/A;  1:00pm   COLONOSCOPY WITH ESOPHAGOGASTRODUODENOSCOPY (EGD)  03/2009   Dr. Darrick Penna: Tortuous colon.  Otherwise normal colon.  Moderate sized sliding hiatal hernia.  Multiple erosions in the antrum.  Small bowel biopsies negative for celiac disease.   POLYPECTOMY  06/23/2018   Procedure: POLYPECTOMY;  Surgeon: West Bali, MD;  Location: [facility];  Service: Endoscopy;;  transverse colon polyp    Family History  Problem Relation Age of Onset   Breast cancer Cousin    Hypertension Mother    Cancer Father        colon cancer, age 32   Cancer Sister        patient doesn't know what kind   Cancer Brother        not sure primary    Social History   Socioeconomic History   Marital status: Single    Spouse name: Not on file   Number of children: Not on file   Years of education: Not on file   Highest education level: Some college, no degree  Occupational History   Not on file  Tobacco Use   Smoking status: Never   Smokeless tobacco: Never  Vaping Use   Vaping Use: Never used  Substance and Sexual Activity   Alcohol use: Yes    Comment: ocassional   Drug use:  No   Sexual activity: Not on file  Other Topics Concern   Not on file  Social History Narrative   Not on file   Social Determinants of Health   Financial Resource Strain: Medium Risk (10/04/2022)   Overall Financial Resource Strain (CARDIA)    Difficulty of Paying Living Expenses: Somewhat hard  Food Insecurity: Food Insecurity Present (10/04/2022)   Hunger Vital Sign    Worried About Running Out of Food in the Last Year: Never true    Ran Out of Food in the Last Year: Sometimes true  Transportation Needs: No Transportation Needs (10/04/2022)   PRAPARE - Administrator, Civil Service (Medical): No    Lack of Transportation (Non-Medical): No  Physical Activity: Insufficiently Active (10/04/2022)   Exercise Vital Sign    Days of Exercise per Week: 6 days     Minutes of Exercise per Session: 10 min  Stress: No Stress Concern Present (10/04/2022)   [employer] of Occupational Health - Occupational Stress Questionnaire    Feeling of Stress : Only a little  Social Connections: Moderately Integrated (10/04/2022)   Social Connection and Isolation Panel [NHANES]    Frequency of Communication with Friends and Family: More than three times a week    Frequency of Social Gatherings with Friends and Family: Twice a week    Attends Religious Services: More than 4 times per year    Active Member of [employer] or Organizations: Yes    Attends Engineer, structural: More than 4 times per year    Marital Status: Divorced  Catering manager Violence: Not on file    Outpatient Medications Prior to Visit  Medication Sig Dispense Refill   ASPIRIN 81 PO Take by mouth daily.     diclofenac (VOLTAREN) 75 MG EC tablet TAKE 1 TABLET(75 MG) BY MOUTH TWICE DAILY WITH A MEAL 60 tablet 5   nitroGLYCERIN (NITROSTAT) 0.4 MG SL tablet Place 1 tablet (0.4 mg total) under the tongue every 5 (five) minutes as needed for chest pain. 90 tablet 3   rosuvastatin (CRESTOR) 5 MG tablet TAKE 1 TABLET(5 MG) BY MOUTH DAILY 90 tablet 1   acetaminophen (TYLENOL) 325 MG tablet Take 650 mg by mouth every 6 (six) hours as needed for mild pain, fever or headache.     amLODipine (NORVASC) 5 MG tablet TAKE 1 TABLET(5 MG) BY MOUTH DAILY 90 tablet 0   hydrochlorothiazide (HYDRODIURIL) 25 MG tablet Take 1 tablet (25 mg total) by mouth daily. 90 tablet 3   omeprazole (PRILOSEC) 20 MG capsule TAKE 1 CAPSULE(20 MG) BY MOUTH DAILY 90 capsule 3   No facility-administered medications prior to visit.    No Known Allergies  ROS Review of Systems  Constitutional:  Negative for chills and fever.  HENT:  Negative for congestion, ear discharge, sinus pressure, sinus pain and sore throat.   Eyes:  Negative for pain and discharge.  Respiratory:  Negative for cough and shortness of breath.    Cardiovascular:  Negative for chest pain and palpitations.  Gastrointestinal:  Positive for constipation. Negative for abdominal pain, diarrhea, nausea and vomiting.  Endocrine: Negative for polydipsia and polyuria.  Genitourinary:  Negative for dysuria and hematuria.  Musculoskeletal:  Positive for arthralgias and myalgias. Negative for neck pain and neck stiffness.       Right foot pain  Skin:  Negative for rash.  Neurological:  Positive for numbness (B/l hands). Negative for dizziness and weakness.  Psychiatric/Behavioral:  Positive for sleep disturbance.  Negative for agitation and behavioral problems.       Objective:    Physical Exam Vitals reviewed.  Constitutional:      General: She is not in acute distress.    Appearance: She is not diaphoretic.  HENT:     Head: Normocephalic and atraumatic.     Nose: Nose normal.     Mouth/Throat:     Mouth: Mucous membranes are moist.     Pharynx: No posterior oropharyngeal erythema.  Eyes:     General: No scleral icterus.    Extraocular Movements: Extraocular movements intact.  Cardiovascular:     Rate and Rhythm: Normal rate and regular rhythm.     Pulses: Normal pulses.     Heart sounds: Normal heart sounds. No murmur heard. Pulmonary:     Breath sounds: Normal breath sounds. No wheezing or rales.  Musculoskeletal:     Cervical back: Neck supple. No tenderness.     Right lower leg: Edema (Mild) present.     Left lower leg: Edema (Mild) present.  Feet:     Comments: Slight erythema over the sole of right foot near the nail injury, C/D/I -  no open wound Skin:    General: Skin is warm.     Findings: No rash.  Neurological:     General: No focal deficit present.     Mental Status: She is alert and oriented to person, place, and time.  Psychiatric:        Mood and Affect: Mood normal.        Behavior: Behavior normal.     BP 126/82 (BP Location: Left Arm, Patient Position: Sitting, Cuff Size: Normal)   Pulse 66   Ht 5'  9" (1.753 m)   Wt 205 lb 6.4 oz (93.2 kg)   SpO2 98%   BMI 30.33 kg/m  Wt Readings from Last 3 Encounters:  10/08/22 205 lb 6.4 oz (93.2 kg)  06/01/22 204 lb 12.8 oz (92.9 kg)  05/24/22 206 lb (93.4 kg)    Lab Results  Component Value Date   TSH 0.723 04/02/2022   Lab Results  Component Value Date   WBC 7.4 04/02/2022   HGB 11.9 04/02/2022   HCT 35.6 04/02/2022   MCV 81 04/02/2022   PLT 232 04/02/2022   Lab Results  Component Value Date   NA 138 04/02/2022   K 3.6 04/02/2022   CO2 22 04/02/2022   GLUCOSE 105 (H) 04/02/2022   BUN 17 04/02/2022   CREATININE 0.95 04/02/2022   BILITOT 0.5 04/02/2022   ALKPHOS 72 04/02/2022   AST 22 04/02/2022   ALT 18 04/02/2022   PROT 7.4 04/02/2022   ALBUMIN 4.4 04/02/2022   CALCIUM 9.6 04/02/2022   ANIONGAP 9 06/12/2020   EGFR 71 04/02/2022   Lab Results  Component Value Date   CHOL 157 04/02/2022   Lab Results  Component Value Date   HDL 53 04/02/2022   Lab Results  Component Value Date   LDLCALC 90 04/02/2022   Lab Results  Component Value Date   TRIG 72 04/02/2022   Lab Results  Component Value Date   CHOLHDL 3.0 04/02/2022   Lab Results  Component Value Date   HGBA1C 6.3 (H) 04/02/2022      Assessment & Plan:   Problem List Items Addressed This Visit       Cardiovascular and Mediastinum   HTN (hypertension) - Primary    BP Readings from Last 1 Encounters:  10/08/22 126/82  Well-controlled  with Amlodipine 5 mg QD and HCTZ 25 mg QD now Counseled for compliance with the medications Advised DASH diet and moderate exercise/walking, at least 150 mins/week      Relevant Medications   amLODipine (NORVASC) 5 MG tablet   hydrochlorothiazide (HYDRODIURIL) 25 MG tablet   Hot flashes    Persistent hot flashes Started Paxil      Relevant Medications   amLODipine (NORVASC) 5 MG tablet   hydrochlorothiazide (HYDRODIURIL) 25 MG tablet   PARoxetine (PAXIL) 10 MG tablet     Digestive   Gastroesophageal  reflux disease    Uncontrolled with Omeprazole 20 mg every day Increased dose of omeprazole to 40 mg every day Needs to avoid hot and spicy food Advised to maintain at least 2 hours between last meal of the day and bedtime      Relevant Medications   omeprazole (PRILOSEC) 40 MG capsule     Other   Hyperlipidemia (Chronic)    On Crestor now Check lipid profile      Relevant Medications   amLODipine (NORVASC) 5 MG tablet   hydrochlorothiazide (HYDRODIURIL) 25 MG tablet   Other Relevant Orders   Lipid Profile   Prediabetes    Lab Results  Component Value Date   HGBA1C 6.3 (H) 04/02/2022  Advised to follow low carb diet for now      Relevant Orders   CMP14+EGFR   Hemoglobin A1c   Right foot pain    Since 09/23/22 Unlikely to be related to the nail injury, she is up to date with Tdap vaccination Advised to apply IcyHot or Voltaren gel Rest, ice and leg elevation      Other Visit Diagnoses     Encounter for screening for HIV       Relevant Orders   HIV antibody (with reflex)       Meds ordered this encounter  Medications   amLODipine (NORVASC) 5 MG tablet    Sig: Take 1 tablet (5 mg total) by mouth daily.    Dispense:  90 tablet    Refill:  1   hydrochlorothiazide (HYDRODIURIL) 25 MG tablet    Sig: Take 1 tablet (25 mg total) by mouth daily.    Dispense:  90 tablet    Refill:  3   omeprazole (PRILOSEC) 40 MG capsule    Sig: Take 1 capsule (40 mg total) by mouth daily.    Dispense:  90 capsule    Refill:  1   PARoxetine (PAXIL) 10 MG tablet    Sig: Take 1 tablet (10 mg total) by mouth daily.    Dispense:  30 tablet    Refill:  3    Follow-up: Return in about 2 months (around 12/08/2022) for GERD and hot flashes.    Anabel Halon, MD

## 2022-10-08 NOTE — Assessment & Plan Note (Signed)
Since 09/23/22 Unlikely to be related to the nail injury, she is up to date with Tdap vaccination Advised to apply IcyHot or Voltaren gel Rest, ice and leg elevation

## 2022-10-08 NOTE — Assessment & Plan Note (Signed)
On Crestor now ?Check lipid profile ?

## 2022-10-08 NOTE — Patient Instructions (Signed)
Please start taking Omeprazole 40 mg once daily.  Please start taking Paroxetine as prescribed for hot flashes.  Please continue to take medications as prescribed.  Please continue to follow low carb diet and perform moderate exercise/walking at least 150 mins/week.

## 2022-10-08 NOTE — Assessment & Plan Note (Signed)
Lab Results  Component Value Date   HGBA1C 6.3 (H) 04/02/2022   Advised to follow low carb diet for now

## 2022-10-08 NOTE — Assessment & Plan Note (Signed)
BP Readings from Last 1 Encounters:  10/08/22 126/82   Well-controlled with Amlodipine 5 mg QD and HCTZ 25 mg QD now Counseled for compliance with the medications Advised DASH diet and moderate exercise/walking, at least 150 mins/week

## 2022-10-08 NOTE — Assessment & Plan Note (Signed)
Persistent hot flashes Started Paxil

## 2022-10-09 ENCOUNTER — Other Ambulatory Visit: Payer: Self-pay | Admitting: Internal Medicine

## 2022-10-09 DIAGNOSIS — E876 Hypokalemia: Secondary | ICD-10-CM

## 2022-10-09 LAB — CMP14+EGFR
ALT: 22 IU/L (ref 0–32)
AST: 23 IU/L (ref 0–40)
Albumin: 4.4 g/dL (ref 3.8–4.9)
Alkaline Phosphatase: 79 IU/L (ref 44–121)
BUN/Creatinine Ratio: 16 (ref 9–23)
BUN: 15 mg/dL (ref 6–24)
Bilirubin Total: 0.4 mg/dL (ref 0.0–1.2)
CO2: 24 mmol/L (ref 20–29)
Calcium: 9.8 mg/dL (ref 8.7–10.2)
Chloride: 99 mmol/L (ref 96–106)
Creatinine, Ser: 0.95 mg/dL (ref 0.57–1.00)
Globulin, Total: 3.1 g/dL (ref 1.5–4.5)
Glucose: 133 mg/dL — ABNORMAL HIGH (ref 70–99)
Potassium: 3.3 mmol/L — ABNORMAL LOW (ref 3.5–5.2)
Sodium: 140 mmol/L (ref 134–144)
Total Protein: 7.5 g/dL (ref 6.0–8.5)
eGFR: 71 mL/min/{1.73_m2} (ref >59)

## 2022-10-09 LAB — LIPID PANEL
Chol/HDL Ratio: 3.3 ratio (ref 0.0–4.4)
Cholesterol, Total: 162 mg/dL (ref 100–199)
HDL: 49 mg/dL (ref >39)
LDL Chol Calc (NIH): 88 mg/dL (ref 0–99)
Triglycerides: 141 mg/dL (ref 0–149)
VLDL Cholesterol Cal: 25 mg/dL (ref 5–40)

## 2022-10-09 LAB — HEMOGLOBIN A1C
Est. average glucose Bld gHb Est-mCnc: 137 mg/dL
Hgb A1c MFr Bld: 6.4 % — ABNORMAL HIGH (ref 4.8–5.6)

## 2022-10-09 LAB — HIV ANTIBODY (ROUTINE TESTING W REFLEX): HIV Screen 4th Generation wRfx: NONREACTIVE

## 2022-10-09 MED ORDER — POTASSIUM CHLORIDE CRYS ER 20 MEQ PO TBCR: 20.0000 meq | EXTENDED_RELEASE_TABLET | Freq: Every day | ORAL | 1 refills | Status: DC

## 2022-12-08 ENCOUNTER — Ambulatory Visit: Payer: BC Managed Care – PPO | Admitting: Internal Medicine

## 2022-12-15 ENCOUNTER — Encounter: Payer: Self-pay | Admitting: Internal Medicine

## 2023-03-12 ENCOUNTER — Other Ambulatory Visit: Payer: Self-pay | Admitting: Internal Medicine

## 2023-03-12 DIAGNOSIS — E782 Mixed hyperlipidemia: Secondary | ICD-10-CM

## 2023-04-13 ENCOUNTER — Telehealth: Payer: Self-pay

## 2023-04-13 NOTE — Telephone Encounter (Signed)
Scheduled

## 2023-04-13 NOTE — Telephone Encounter (Signed)
 Copied from CRM [id number]. Topic: Clinical - Medical Advice >> Apr 13, 2023  2:16 PM Victorino Dike T wrote: Reason for CRM: Patient has chest congestion with cough, body aches, and chills.  Unable to come today. Please call her [phone]

## 2023-04-15 NOTE — Progress Notes (Addendum)
 Virtual Visit via Video Note  I connected with Tara Wolf on 04/16/23 at  2:00 PM EST by a video enabled telemedicine application and verified that I am speaking with the correct person using two identifiers.  Patient Location: Home Provider Location: Office/Clinic  I discussed the limitations, risks, security, and privacy concerns of performing an evaluation and management service by video and the availability of in person appointments. I also discussed with the patient that there may be a patient responsible charge related to this service. The patient expressed understanding and agreed to proceed.  Subjective: PCP: Tobie Suzzane POUR, MD  Chief Complaint  Patient presents with   Acute Visit    Chest congestion with pain in ribs and chest due to cough,  body aches, chills. Since Sunday.    Patient complains persistent  dry cough. Patient describes symptoms of nausea, wheezing, shortness of breath at rest, cough, fatigue, headache, malaise, myalgias, sputum production, and night sweats. Symptoms began a several days ago and are gradually worsening since that time. Patient denies chest pain and vomiting. Treatment thus far includes OTC analgesics/antipyretics: somewhat effective and anti-tussive: somewhat effective Past pulmonary with no history for pneumonia        ROS: Per HPI  Current Outpatient Medications:    amLODipine  (NORVASC ) 5 MG tablet, Take 1 tablet (5 mg total) by mouth daily., Disp: 90 tablet, Rfl: 1   ASPIRIN  81 PO, Take by mouth daily., Disp: , Rfl:    azithromycin  (ZITHROMAX ) 250 MG tablet, Take 2 tablets on day 1, then 1 tablet daily on days 2 through 5, Disp: 6 tablet, Rfl: 0   benzonatate  (TESSALON ) 200 MG capsule, Take 1 capsule (200 mg total) by mouth 2 (two) times daily as needed for cough., Disp: 20 capsule, Rfl: 0   omeprazole  (PRILOSEC) 40 MG capsule, Take 1 capsule (40 mg total) by mouth daily., Disp: 90 capsule, Rfl: 1   rosuvastatin  (CRESTOR ) 5  MG tablet, TAKE 1 TABLET(5 MG) BY MOUTH DAILY, Disp: 90 tablet, Rfl: 1   diclofenac  (VOLTAREN ) 75 MG EC tablet, TAKE 1 TABLET(75 MG) BY MOUTH TWICE DAILY WITH A MEAL (Patient not taking: Reported on 04/16/2023), Disp: 60 tablet, Rfl: 5   hydrochlorothiazide  (HYDRODIURIL ) 25 MG tablet, Take 1 tablet (25 mg total) by mouth daily. (Patient not taking: Reported on 04/16/2023), Disp: 90 tablet, Rfl: 3   nitroGLYCERIN  (NITROSTAT ) 0.4 MG SL tablet, Place 1 tablet (0.4 mg total) under the tongue every 5 (five) minutes as needed for chest pain., Disp: 90 tablet, Rfl: 3   PARoxetine  (PAXIL ) 10 MG tablet, Take 1 tablet (10 mg total) by mouth daily. (Patient not taking: Reported on 04/16/2023), Disp: 30 tablet, Rfl: 3   potassium chloride  SA (KLOR-CON  M) 20 MEQ tablet, Take 1 tablet (20 mEq total) by mouth daily. (Patient not taking: Reported on 04/16/2023), Disp: 90 tablet, Rfl: 1  Observations/Objective: Today's Vitals   04/16/23 1342  Weight: 209 lb (94.8 kg)  Height: 5' 9 (1.753 m)  PainSc: 5   PainLoc: Chest   Physical Exam Patient is alert and no acute distress noted.   Assessment and Plan: Upper respiratory tract infection, unspecified type Assessment & Plan: Azithromycin  250 mg twice daily x 5 days,  Benzonatate  200 mg PRN, tea, or broth. Using a humidifier can help soothe throat irritation and ease nasal congestion. For fever or pain, acetaminophen (Tylenol) is recommended. To relieve other symptoms, try saline nasal sprays, throat lozenges, or gargling with saltwater. Focus on eating  light, healthy meals like fruits and vegetables to keep your strength up. Practice good hygiene by washing your hands frequently and covering your mouth when coughing or sneezing.Follow-up for worsening or persistent symptoms. Patient verbalizes understanding regarding plan of care and all questions answered    Orders: -     Azithromycin ; Take 2 tablets on day 1, then 1 tablet daily on days 2 through 5  Dispense: 6 tablet;  Refill: 0 -     Benzonatate ; Take 1 capsule (200 mg total) by mouth 2 (two) times daily as needed for cough.  Dispense: 20 capsule; Refill: 0    Follow Up Instructions: No follow-ups on file.   I discussed the assessment and treatment plan with the patient. The patient was provided an opportunity to ask questions, and all were answered. The patient agreed with the plan and demonstrated an understanding of the instructions.   The patient was advised to call back or seek an in-person evaluation if the symptoms worsen or if the condition fails to improve as anticipated.  The above assessment and management plan was discussed with the patient. The patient verbalized understanding of and has agreed to the management plan.   Hilario Kidd Wilhelmena Falter, FNP

## 2023-04-16 ENCOUNTER — Telehealth (INDEPENDENT_AMBULATORY_CARE_PROVIDER_SITE_OTHER): Payer: 59 | Admitting: Family Medicine

## 2023-04-16 ENCOUNTER — Encounter: Payer: Self-pay | Admitting: Family Medicine

## 2023-04-16 VITALS — Ht 69.0 in | Wt 209.0 lb

## 2023-04-16 DIAGNOSIS — J069 Acute upper respiratory infection, unspecified: Secondary | ICD-10-CM

## 2023-04-16 MED ORDER — AZITHROMYCIN 250 MG PO TABS: ORAL_TABLET | ORAL | 0 refills | Status: DC

## 2023-04-16 MED ORDER — BENZONATATE 200 MG PO CAPS: 200.0000 mg | ORAL_CAPSULE | Freq: Two times a day (BID) | ORAL | 0 refills | Status: DC | PRN

## 2023-04-16 NOTE — Assessment & Plan Note (Signed)
 Azithromycin  250 mg twice daily x 5 days,  Benzonatate  200 mg PRN, tea, or broth. Using a humidifier can help soothe throat irritation and ease nasal congestion. For fever or pain, acetaminophen (Tylenol) is recommended. To relieve other symptoms, try saline nasal sprays, throat lozenges, or gargling with saltwater. Focus on eating light, healthy meals like fruits and vegetables to keep your strength up. Practice good hygiene by washing your hands frequently and covering your mouth when coughing or sneezing.Follow-up for worsening or persistent symptoms. Patient verbalizes understanding regarding plan of care and all questions answered

## 2023-05-19 ENCOUNTER — Encounter (INDEPENDENT_AMBULATORY_CARE_PROVIDER_SITE_OTHER): Payer: Self-pay | Admitting: *Deleted

## 2023-06-15 ENCOUNTER — Telehealth: Payer: Self-pay | Admitting: Internal Medicine

## 2023-06-15 NOTE — Telephone Encounter (Signed)
 Copied from CRM [id number]. Topic: Appointments - Scheduling Inquiry for Clinic >> Jun 14, 2023  5:05 PM Antony Haste wrote: Reason for CRM: The patient is needing to schedule an acute visit for frequent discomfort in her chest, she did not want to speak with a triage nurse about her symptoms. I reviewed every provider's openings in [employer] and the soonest availability starts in April for an acute visit. Can she be scheduled anytime during March instead? Callback [phone]

## 2023-06-15 NOTE — Telephone Encounter (Signed)
Pt mailbox full.

## 2023-08-30 ENCOUNTER — Ambulatory Visit: Payer: Self-pay

## 2023-11-09 ENCOUNTER — Ambulatory Visit: Payer: Self-pay

## 2023-11-09 ENCOUNTER — Encounter: Payer: Self-pay | Admitting: Nurse Practitioner

## 2023-11-09 NOTE — Telephone Encounter (Signed)
 Left message recommending urgent care

## 2023-11-09 NOTE — Telephone Encounter (Signed)
 FYI Only or Action Required?: FYI only for provider.  Patient was last seen in primary care on 04/16/2023 by Terry Wilhelmena Lloyd Hilario, FNP.  Called Nurse Triage reporting Otalgia.  Symptoms began several days ago.  Interventions attempted: OTC medications: ear drops .  Symptoms are: gradually worsening.  Triage Disposition: See Physician Within 24 Hours  Patient/caregiver understands and will follow disposition?: Yes      Copied from CRM [id number]. Topic: Clinical - Red Word Triage >> Nov 09, 2023  8:42 AM Treva T wrote: Red Word that prompted transfer to Nurse Triage: Patient reports she has left ear pain, that wakes her up during the night. Reason for Disposition  Earache  (Exceptions: Brief ear pain of lasting less than 60 minutes, or earache occurring during air travel.)  Answer Assessment - Initial Assessment Questions 1. LOCATION: Which ear is involved?     Left ear 2. ONSET: When did the ear pain start?      Last week around Thursday 3. SEVERITY: How bad is the pain?  (Scale 1-10; mild, moderate or severe)     2/10   4. URI SYMPTOMS: Do you have a runny nose or cough?     no 5. FEVER: Do you have a fever? If Yes, ask: What is your temperature, how was it measured, and when did it start?     no 6. CAUSE: Have you been swimming recently?, How often do you use Q-TIPS?, Have you had any recent air travel or scuba diving?     Uses Q tips to clean ears a few times a week 7. OTHER SYMPTOMS: Do you have any other symptoms? (e.g., decreased hearing, dizziness, headache, stiff neck, vomiting)     headache  Protocols used: Earache-A-AH

## 2023-12-03 ENCOUNTER — Encounter: Payer: Self-pay | Admitting: Radiology

## 2023-12-23 ENCOUNTER — Ambulatory Visit (INDEPENDENT_AMBULATORY_CARE_PROVIDER_SITE_OTHER): Payer: Self-pay | Admitting: Internal Medicine

## 2023-12-23 ENCOUNTER — Encounter: Payer: Self-pay | Admitting: Internal Medicine

## 2023-12-23 VITALS — BP 133/77 | HR 67 | Ht 69.0 in | Wt 202.6 lb

## 2023-12-23 DIAGNOSIS — Z23 Encounter for immunization: Secondary | ICD-10-CM | POA: Diagnosis not present

## 2023-12-23 DIAGNOSIS — K219 Gastro-esophageal reflux disease without esophagitis: Secondary | ICD-10-CM | POA: Diagnosis not present

## 2023-12-23 DIAGNOSIS — E782 Mixed hyperlipidemia: Secondary | ICD-10-CM

## 2023-12-23 DIAGNOSIS — S46812A Strain of other muscles, fascia and tendons at shoulder and upper arm level, left arm, initial encounter: Secondary | ICD-10-CM

## 2023-12-23 DIAGNOSIS — Z0001 Encounter for general adult medical examination with abnormal findings: Secondary | ICD-10-CM

## 2023-12-23 DIAGNOSIS — R7303 Prediabetes: Secondary | ICD-10-CM

## 2023-12-23 DIAGNOSIS — M25561 Pain in right knee: Secondary | ICD-10-CM

## 2023-12-23 DIAGNOSIS — M25562 Pain in left knee: Secondary | ICD-10-CM

## 2023-12-23 DIAGNOSIS — I10 Essential (primary) hypertension: Secondary | ICD-10-CM | POA: Diagnosis not present

## 2023-12-23 DIAGNOSIS — G8929 Other chronic pain: Secondary | ICD-10-CM

## 2023-12-23 DIAGNOSIS — E559 Vitamin D deficiency, unspecified: Secondary | ICD-10-CM

## 2023-12-23 DIAGNOSIS — N951 Menopausal and female climacteric states: Secondary | ICD-10-CM

## 2023-12-23 DIAGNOSIS — F411 Generalized anxiety disorder: Secondary | ICD-10-CM

## 2023-12-23 MED ORDER — ESOMEPRAZOLE MAGNESIUM 40 MG PO CPDR: 40.0000 mg | DELAYED_RELEASE_CAPSULE | Freq: Every day | ORAL | 5 refills | Status: AC

## 2023-12-23 MED ORDER — VEOZAH 45 MG PO TABS: 45.0000 mg | ORAL_TABLET | Freq: Every day | ORAL | Status: DC

## 2023-12-23 MED ORDER — HYDROCHLOROTHIAZIDE 25 MG PO TABS: 25.0000 mg | ORAL_TABLET | Freq: Every day | ORAL | 3 refills | Status: AC

## 2023-12-23 MED ORDER — VEOZAH 45 MG PO TABS: 1.0000 | ORAL_TABLET | Freq: Every day | ORAL | 5 refills | Status: AC

## 2023-12-23 MED ORDER — TRAZODONE HCL 50 MG PO TABS: 25.0000 mg | ORAL_TABLET | Freq: Every evening | ORAL | 3 refills | Status: DC | PRN

## 2023-12-23 NOTE — Patient Instructions (Signed)
 Please start taking Nexium  40 mg as prescribed for acid reflux. Please follow up with GI.  Please continue to take other medications as prescribed.  Please continue to follow low carb diet and perform moderate exercise/walking at least 150 mins/week.

## 2023-12-23 NOTE — Assessment & Plan Note (Addendum)
 Physical exam as documented. Fasting blood tests ordered. Flu vaccine today.  Prevnar 20 vaccine discussed, she prefers to do it later.

## 2023-12-23 NOTE — Assessment & Plan Note (Addendum)
 With stiffness in the mornings Likely due to OA of knee Takes ibuprofen  Needs to avoid daily dosing of oral NSAIDs as she has GERD, advised to alternate with Tylenol arthritis

## 2023-12-23 NOTE — Assessment & Plan Note (Signed)
 Almost daily hot flashes Has tried Paxil , but did not tolerate it Unable to take hormone replacement therapy due to other medical conditions Started Veozah  45 mg QD

## 2023-12-23 NOTE — Assessment & Plan Note (Signed)
 Had started Crestor , but she has stopped taking it now Check lipid profile

## 2023-12-23 NOTE — Assessment & Plan Note (Signed)
 Due to financial constraint currently Has insomnia as well, leading to tension headache Started trazodone  as needed for insomnia for now

## 2023-12-23 NOTE — Progress Notes (Signed)
 Established Patient Office Visit  Subjective:  Patient ID: Tara Wolf, female    DOB: 1965-09-30  Age: 58 y.o. MRN: [id number]  CC:  Chief Complaint  Patient presents with   Annual Exam    Cpe    Headache    Reports frequent headaches.     HPI Tara Wolf is a 58 y.o. female with past medical history of HTN, GERD, IBS-C and HLD who presents for f/u of her chronic medical conditions.  HTN: BP is well-controlled now. Takes medications regularly. Patient denies dizziness, chest pain, dyspnea or palpitations.  Her leg swelling has improved with HCTZ now.  GERD: She has persistent acid reflux despite taking OTC omeprazole  and Nexium .  It keeps her awake at nighttime.  Denies any nausea or vomiting currently.  Denies any dysphagia or odynophagia.  Hot flashes: She reports severe hot flashes, almost on a daily basis.  She has had menopause for many years.  Does not not report vaginal dryness, itching or discharge currently. She did not tolerate Paxil .  GAD: She reports anxiety for the last few months.  She is trying to avoid foreclosure of her home and has financial constraints currently.  She has difficulty sleeping at nighttime.  She reports headache due to ongoing stress, which is intermittent, dull, bilateral.  Denies any visual disturbance currently.     Past Medical History:  Diagnosis Date   Arthritis    Chronic constipation    GERD (gastroesophageal reflux disease)    H/O: hysterectomy 07/10/2020   IBS (irritable bowel syndrome)    Nausea and vomiting    chronic   Syncope 05/19/2017    Past Surgical History:  Procedure Laterality Date   ABDOMINAL HYSTERECTOMY     BREAST BIOPSY Left    neg   COLONOSCOPY N/A 06/23/2018   Procedure: COLONOSCOPY;  Surgeon: Harvey Margo CROME, MD;  Location: [facility];  Service: Endoscopy;  Laterality: N/A;  1:00pm   COLONOSCOPY WITH ESOPHAGOGASTRODUODENOSCOPY (EGD)  03/2009   Dr. harvey: Tortuous colon.  Otherwise  normal colon.  Moderate sized sliding hiatal hernia.  Multiple erosions in the antrum.  Small bowel biopsies negative for celiac disease.   POLYPECTOMY  06/23/2018   Procedure: POLYPECTOMY;  Surgeon: Harvey Margo CROME, MD;  Location: [facility];  Service: Endoscopy;;  transverse colon polyp    Family History  Problem Relation Age of Onset   Breast cancer Cousin    Hypertension Mother    Cancer Father        colon cancer, age 46   Cancer Sister        patient doesn't know what kind   Cancer Brother        not sure primary    Social History   Socioeconomic History   Marital status: Single    Spouse name: Not on file   Number of children: Not on file   Years of education: Not on file   Highest education level: Some college, no degree  Occupational History   Not on file  Tobacco Use   Smoking status: Never   Smokeless tobacco: Never  Vaping Use   Vaping status: Never Used  Substance and Sexual Activity   Alcohol use: Yes    Comment: ocassional   Drug use: No   Sexual activity: Not on file  Other Topics Concern   Not on file  Social History Narrative   Not on file   Social Drivers of Corporate investment banker  Strain: Medium Risk (10/04/2022)   Overall Financial Resource Strain (CARDIA)    Difficulty of Paying Living Expenses: Somewhat hard  Food Insecurity: Food Insecurity Present (10/04/2022)   Hunger Vital Sign    Worried About Running Out of Food in the Last Year: Never true    Ran Out of Food in the Last Year: Sometimes true  Transportation Needs: No Transportation Needs (10/04/2022)   PRAPARE - Administrator, Civil Service (Medical): No    Lack of Transportation (Non-Medical): No  Physical Activity: Insufficiently Active (10/04/2022)   Exercise Vital Sign    Days of Exercise per Week: 6 days    Minutes of Exercise per Session: 10 min  Stress: No Stress Concern Present (10/04/2022)   [employer] of Occupational Health - Occupational Stress  Questionnaire    Feeling of Stress : Only a little  Social Connections: Moderately Integrated (10/04/2022)   Social Connection and Isolation Panel    Frequency of Communication with Friends and Family: More than three times a week    Frequency of Social Gatherings with Friends and Family: Twice a week    Attends Religious Services: More than 4 times per year    Active Member of [employer] or Organizations: Yes    Attends Engineer, structural: More than 4 times per year    Marital Status: Divorced  Catering manager Violence: Not on file    Outpatient Medications Prior to Visit  Medication Sig Dispense Refill   nitroGLYCERIN  (NITROSTAT ) 0.4 MG SL tablet Place 1 tablet (0.4 mg total) under the tongue every 5 (five) minutes as needed for chest pain. 90 tablet 3   esomeprazole  (NEXIUM ) 20 MG capsule Take 20 mg by mouth daily at 12 noon.     hydrochlorothiazide  (HYDRODIURIL ) 25 MG tablet Take 1 tablet (25 mg total) by mouth daily. 90 tablet 3   omeprazole  (PRILOSEC) 40 MG capsule Take 1 capsule (40 mg total) by mouth daily. 90 capsule 1   amLODipine  (NORVASC ) 5 MG tablet Take 1 tablet (5 mg total) by mouth daily. 90 tablet 1   ASPIRIN  81 PO Take by mouth daily.     azithromycin  (ZITHROMAX ) 250 MG tablet Take 2 tablets on day 1, then 1 tablet daily on days 2 through 5 6 tablet 0   benzonatate  (TESSALON ) 200 MG capsule Take 1 capsule (200 mg total) by mouth 2 (two) times daily as needed for cough. 20 capsule 0   diclofenac  (VOLTAREN ) 75 MG EC tablet TAKE 1 TABLET(75 MG) BY MOUTH TWICE DAILY WITH A MEAL (Patient not taking: Reported on 04/16/2023) 60 tablet 5   PARoxetine  (PAXIL ) 10 MG tablet Take 1 tablet (10 mg total) by mouth daily. (Patient not taking: Reported on 04/16/2023) 30 tablet 3   potassium chloride  SA (KLOR-CON  M) 20 MEQ tablet Take 1 tablet (20 mEq total) by mouth daily. (Patient not taking: Reported on 04/16/2023) 90 tablet 1   rosuvastatin  (CRESTOR ) 5 MG tablet TAKE 1 TABLET(5 MG) BY  MOUTH DAILY 90 tablet 1   No facility-administered medications prior to visit.    No Known Allergies  ROS Review of Systems  Constitutional:  Negative for chills and fever.  HENT:  Negative for congestion, ear discharge, sinus pressure, sinus pain and sore throat.   Eyes:  Negative for pain and discharge.  Respiratory:  Negative for cough and shortness of breath.   Cardiovascular:  Negative for chest pain and palpitations.  Gastrointestinal:  Positive for abdominal pain and constipation. Negative  for diarrhea, nausea and vomiting.  Endocrine: Negative for polydipsia and polyuria.  Genitourinary:  Negative for dysuria and hematuria.  Musculoskeletal:  Positive for arthralgias, myalgias and neck pain. Negative for neck stiffness.       Right foot pain  Skin:  Negative for rash.  Neurological:  Positive for numbness (B/l hands) and headaches. Negative for dizziness and weakness.  Psychiatric/Behavioral:  Positive for sleep disturbance. Negative for agitation and behavioral problems. The patient is nervous/anxious.       Objective:    Physical Exam Vitals reviewed.  Constitutional:      General: She is not in acute distress.    Appearance: She is not diaphoretic.  HENT:     Head: Normocephalic and atraumatic.     Nose: Nose normal.     Mouth/Throat:     Mouth: Mucous membranes are moist.     Pharynx: No posterior oropharyngeal erythema.  Eyes:     General: No scleral icterus.    Extraocular Movements: Extraocular movements intact.  Cardiovascular:     Rate and Rhythm: Normal rate and regular rhythm.     Heart sounds: Normal heart sounds. No murmur heard. Pulmonary:     Breath sounds: Normal breath sounds. No wheezing or rales.  Abdominal:     Palpations: Abdomen is soft.     Tenderness: There is abdominal tenderness (Mild, epigastric).  Musculoskeletal:     Cervical back: Neck supple. No tenderness. Pain with movement present.     Right lower leg: Edema (Mild) present.      Left lower leg: Edema (Mild) present.  Skin:    General: Skin is warm.     Findings: No rash.  Neurological:     General: No focal deficit present.     Mental Status: She is alert and oriented to person, place, and time.     Cranial Nerves: No cranial nerve deficit.     Sensory: No sensory deficit.     Motor: No weakness.  Psychiatric:        Mood and Affect: Mood is anxious.        Behavior: Behavior is cooperative.     BP 133/77   Pulse 67   Ht 5' 9 (1.753 m)   Wt 202 lb 9.6 oz (91.9 kg)   SpO2 100%   BMI 29.92 kg/m  Wt Readings from Last 3 Encounters:  12/23/23 202 lb 9.6 oz (91.9 kg)  04/16/23 209 lb (94.8 kg)  10/08/22 205 lb 6.4 oz (93.2 kg)    Lab Results  Component Value Date   TSH 0.723 04/02/2022   Lab Results  Component Value Date   WBC 7.4 04/02/2022   HGB 11.9 04/02/2022   HCT 35.6 04/02/2022   MCV 81 04/02/2022   PLT 232 04/02/2022   Lab Results  Component Value Date   NA 140 10/08/2022   K 3.3 (L) 10/08/2022   CO2 24 10/08/2022   GLUCOSE 133 (H) 10/08/2022   BUN 15 10/08/2022   CREATININE 0.95 10/08/2022   BILITOT 0.4 10/08/2022   ALKPHOS 79 10/08/2022   AST 23 10/08/2022   ALT 22 10/08/2022   PROT 7.5 10/08/2022   ALBUMIN 4.4 10/08/2022   CALCIUM  9.8 10/08/2022   ANIONGAP 9 06/12/2020   EGFR 71 10/08/2022   Lab Results  Component Value Date   CHOL 162 10/08/2022   Lab Results  Component Value Date   HDL 49 10/08/2022   Lab Results  Component Value Date   LDLCALC  88 10/08/2022   Lab Results  Component Value Date   TRIG 141 10/08/2022   Lab Results  Component Value Date   CHOLHDL 3.3 10/08/2022   Lab Results  Component Value Date   HGBA1C 6.4 (H) 10/08/2022      Assessment & Plan:   Problem List Items Addressed This Visit       Cardiovascular and Mediastinum   HTN (hypertension)   BP Readings from Last 1 Encounters:  12/23/23 133/77   Well-controlled with HCTZ 25 mg QD Counseled for compliance with the  medications Advised DASH diet and moderate exercise/walking, at least 150 mins/week      Relevant Medications   hydrochlorothiazide  (HYDRODIURIL ) 25 MG tablet   Other Relevant Orders   TSH   CMP14+EGFR   CBC with Differential/Platelet   Hot flashes due to menopause   Almost daily hot flashes Has tried Paxil , but did not tolerate it Unable to take hormone replacement therapy due to other medical conditions Started Veozah  45 mg QD      Relevant Medications   hydrochlorothiazide  (HYDRODIURIL ) 25 MG tablet   Fezolinetant  (VEOZAH ) 45 MG TABS   Fezolinetant  (VEOZAH ) 45 MG TABS     Digestive   Gastroesophageal reflux disease   Uncontrolled with Nexium  20 mg QD Increase dose of Nexium  to 40 mg once daily Has tried omeprazole  in the past Needs to avoid hot and spicy food Advised to maintain at least 2 hours between last meal of the day and bedtime Referred to GI      Relevant Medications   esomeprazole  (NEXIUM ) 40 MG capsule   Other Relevant Orders   Ambulatory referral to Gastroenterology     Musculoskeletal and Integument   Strain of left trapezius muscle   Has left-sided neck pain since 12/16/23, likely due to trapezius strain Advised to take Tylenol as needed Advised to perform simple massages        Other   Hyperlipidemia (Chronic)   Had started Crestor , but she has stopped taking it now Check lipid profile      Relevant Medications   hydrochlorothiazide  (HYDRODIURIL ) 25 MG tablet   Other Relevant Orders   Lipid panel   Vitamin D  deficiency   Relevant Orders   VITAMIN D  25 Hydroxy (Vit-D Deficiency, Fractures)   Prediabetes   Lab Results  Component Value Date   HGBA1C 6.4 (H) 10/08/2022   Advised to follow low carb diet for now      Relevant Orders   Hemoglobin A1c   CMP14+EGFR   Chronic pain of both knees   With stiffness in the mornings Likely due to OA of knee Takes ibuprofen  Needs to avoid daily dosing of oral NSAIDs as she has GERD, advised to  alternate with Tylenol arthritis      Relevant Medications   traZODone  (DESYREL ) 50 MG tablet   Encounter for general adult medical examination with abnormal findings - Primary   Physical exam as documented. Fasting blood tests ordered. Flu vaccine today.  Prevnar 20 vaccine discussed, she prefers to do it later.      GAD (generalized anxiety disorder)   Due to financial constraint currently Has insomnia as well, leading to tension headache Started trazodone  as needed for insomnia for now      Relevant Medications   traZODone  (DESYREL ) 50 MG tablet   Other Visit Diagnoses       Encounter for immunization       Relevant Orders   Flu vaccine trivalent PF, 6mos and  older(Flulaval,Afluria,Fluarix,Fluzone) (Completed)       Meds ordered this encounter  Medications   esomeprazole  (NEXIUM ) 40 MG capsule    Sig: Take 1 capsule (40 mg total) by mouth daily at 12 noon.    Dispense:  30 capsule    Refill:  5   hydrochlorothiazide  (HYDRODIURIL ) 25 MG tablet    Sig: Take 1 tablet (25 mg total) by mouth daily.    Dispense:  90 tablet    Refill:  3   Fezolinetant  (VEOZAH ) 45 MG TABS    Sig: Take 1 tablet (45 mg total) by mouth daily.    Dispense:  30 tablet    Refill:  5   traZODone  (DESYREL ) 50 MG tablet    Sig: Take 0.5-1 tablets (25-50 mg total) by mouth at bedtime as needed for sleep.    Dispense:  30 tablet    Refill:  3   Fezolinetant  (VEOZAH ) 45 MG TABS    Sig: Take 1 tablet (45 mg total) by mouth daily.    Lot Number?:   [id number]    Expiration Date?:   09/09/2024    Quantity:   1    Follow-up: Return in about 4 months (around 04/23/2024).    Suzzane MARLA Blanch, MD

## 2023-12-23 NOTE — Assessment & Plan Note (Addendum)
 Has left-sided neck pain since 12/16/23, likely due to trapezius strain Advised to take Tylenol as needed Advised to perform simple massages

## 2023-12-23 NOTE — Assessment & Plan Note (Addendum)
 Uncontrolled with Nexium  20 mg QD Increase dose of Nexium  to 40 mg once daily Has tried omeprazole  in the past Needs to avoid hot and spicy food Advised to maintain at least 2 hours between last meal of the day and bedtime Referred to GI

## 2023-12-23 NOTE — Assessment & Plan Note (Addendum)
 BP Readings from Last 1 Encounters:  12/23/23 133/77   Well-controlled with HCTZ 25 mg QD Counseled for compliance with the medications Advised DASH diet and moderate exercise/walking, at least 150 mins/week

## 2023-12-23 NOTE — Assessment & Plan Note (Signed)
 Lab Results  Component Value Date   HGBA1C 6.4 (H) 10/08/2022   Advised to follow low carb diet for now

## 2023-12-24 ENCOUNTER — Telehealth: Payer: Self-pay

## 2023-12-24 ENCOUNTER — Ambulatory Visit: Payer: Self-pay | Admitting: Internal Medicine

## 2023-12-24 LAB — CBC WITH DIFFERENTIAL/PLATELET
Basophils Absolute: 0 x10E3/uL (ref 0.0–0.2)
Basos: 1 %
EOS (ABSOLUTE): 0.1 x10E3/uL (ref 0.0–0.4)
Eos: 2 %
Hematocrit: 40.9 % (ref 34.0–46.6)
Hemoglobin: 12.7 g/dL (ref 11.1–15.9)
Immature Grans (Abs): 0 x10E3/uL (ref 0.0–0.1)
Immature Granulocytes: 0 %
Lymphocytes Absolute: 2.4 x10E3/uL (ref 0.7–3.1)
Lymphs: 49 %
MCH: 26 pg — ABNORMAL LOW (ref 26.6–33.0)
MCHC: 31.1 g/dL — ABNORMAL LOW (ref 31.5–35.7)
MCV: 84 fL (ref 79–97)
Monocytes Absolute: 0.3 x10E3/uL (ref 0.1–0.9)
Monocytes: 6 %
Neutrophils Absolute: 2 x10E3/uL (ref 1.4–7.0)
Neutrophils: 42 %
Platelets: 247 x10E3/uL (ref 150–450)
RBC: 4.89 x10E6/uL (ref 3.77–5.28)
RDW: 14.5 % (ref 11.7–15.4)
WBC: 4.8 x10E3/uL (ref 3.4–10.8)

## 2023-12-24 LAB — CMP14+EGFR
ALT: 15 IU/L (ref 0–32)
AST: 21 IU/L (ref 0–40)
Albumin: 4.3 g/dL (ref 3.8–4.9)
Alkaline Phosphatase: 76 IU/L (ref 44–121)
BUN/Creatinine Ratio: 20 (ref 9–23)
BUN: 18 mg/dL (ref 6–24)
Bilirubin Total: 0.5 mg/dL (ref 0.0–1.2)
CO2: 24 mmol/L (ref 20–29)
Calcium: 9.9 mg/dL (ref 8.7–10.2)
Chloride: 98 mmol/L (ref 96–106)
Creatinine, Ser: 0.89 mg/dL (ref 0.57–1.00)
Globulin, Total: 3.2 g/dL (ref 1.5–4.5)
Glucose: 104 mg/dL — ABNORMAL HIGH (ref 70–99)
Potassium: 3.4 mmol/L — ABNORMAL LOW (ref 3.5–5.2)
Sodium: 139 mmol/L (ref 134–144)
Total Protein: 7.5 g/dL (ref 6.0–8.5)
eGFR: 76 mL/min/1.73 (ref >59)

## 2023-12-24 LAB — VITAMIN D 25 HYDROXY (VIT D DEFICIENCY, FRACTURES): Vit D, 25-Hydroxy: 35.4 ng/mL (ref 30.0–100.0)

## 2023-12-24 LAB — LIPID PANEL
Chol/HDL Ratio: 4.9 ratio — ABNORMAL HIGH (ref 0.0–4.4)
Cholesterol, Total: 244 mg/dL — ABNORMAL HIGH (ref 100–199)
HDL: 50 mg/dL (ref >39)
LDL Chol Calc (NIH): 178 mg/dL — ABNORMAL HIGH (ref 0–99)
Triglycerides: 90 mg/dL (ref 0–149)
VLDL Cholesterol Cal: 16 mg/dL (ref 5–40)

## 2023-12-24 LAB — TSH: TSH: 1.28 u[IU]/mL (ref 0.450–4.500)

## 2023-12-24 LAB — HEMOGLOBIN A1C
Est. average glucose Bld gHb Est-mCnc: 134 mg/dL
Hgb A1c MFr Bld: 6.3 % — ABNORMAL HIGH (ref 4.8–5.6)

## 2023-12-24 MED ORDER — ROSUVASTATIN CALCIUM 5 MG PO TABS: 5.0000 mg | ORAL_TABLET | Freq: Every day | ORAL | 3 refills | Status: AC

## 2023-12-24 NOTE — Telephone Encounter (Signed)
 Returned lab call unable to reach pt.

## 2023-12-24 NOTE — Addendum Note (Signed)
 Addended byBETHA TOBIE DOWNS on: 12/24/2023 07:56 AM   Modules accepted: Orders

## 2023-12-24 NOTE — Telephone Encounter (Signed)
 Copied from CRM #[id number]. Topic: Clinical - Lab/Test Results >> Dec 24, 2023 10:08 AM [place] A wrote: Reason for CRM: Patient returned call for [facility] call

## 2023-12-27 ENCOUNTER — Encounter: Payer: Self-pay | Admitting: Internal Medicine

## 2024-01-12 ENCOUNTER — Encounter: Payer: Self-pay | Admitting: Internal Medicine

## 2024-01-15 ENCOUNTER — Other Ambulatory Visit: Payer: Self-pay | Admitting: Internal Medicine

## 2024-01-15 DIAGNOSIS — F411 Generalized anxiety disorder: Secondary | ICD-10-CM

## 2024-02-14 ENCOUNTER — Encounter: Payer: Self-pay | Admitting: Radiology

## 2024-03-27 ENCOUNTER — Ambulatory Visit: Payer: Self-pay

## 2024-03-27 NOTE — Telephone Encounter (Signed)
 This RN attempted to call pt x2 with no answer. The recording stated, the mailbox is full and cannot accept new messages at this time.     Copied from CRM #[id number]. Topic: Clinical - Red Word Triage >> Mar 27, 2024  3:33 PM Fonda T wrote: Red Word that prompted transfer to Nurse Triage: Pt states was seen in [facility] and discharged for shortness of breath and chest discomfort, and high blood pressure.  Pt calling states she is still having shortness of breath, chest discomfort, and worsening headaches.  Pt requesting an appt for hospital follow up.  Ph. [phone] >> Mar 27, 2024  4:00 PM Fonda T wrote: Pt unable to hold any longer, requesting a return call for shortness of breath, and chest discomfort.  Ph. [phone]

## 2024-03-28 ENCOUNTER — Ambulatory Visit: Payer: Self-pay | Admitting: *Deleted

## 2024-03-28 NOTE — Telephone Encounter (Signed)
 FYI Only or Action Required?: FYI only for provider: ED advised and patient has not been taking prescribed potassium as directed by last ED visit on 03/23/24.  Patient was last seen in primary care on 12/23/2023 by Tobie Suzzane POUR, MD.  Called Nurse Triage reporting Chest Pain.  Symptoms began several days ago.  Interventions attempted: Prescription medications: potassium 10 mEq 2 x days but patient has not been taking due to thinking medication causing her to feel tired .  Symptoms are: unchanged.  Triage Disposition: Go to ED Now (or PCP Triage)  Patient/caregiver understands and will follow disposition?: No, wishes to speak with PCP  CAL , Brittany notified unsure patient will go back to ED.              Copied from CRM #[id number]. Topic: Clinical - Red Word Triage >> Mar 28, 2024  9:29 AM Leonette P wrote: Chest pain  SOB .  Called yesterday Reason for Disposition  Patient sounds very sick or weak to the triager  Answer Assessment - Initial Assessment Questions Patient calling to scheduled hospital f/u appt. Scheduled for 04/11/24. Patient reports she is still experiencing chest pressure and SOB with exertion. Evaluated in ED on 03/23/24 and prescribed potassium. Patient reports she has not been taking medication due to medicine makes me feel tired and can not take while working. In review of chart, current med list shows robaxin  on list. Instructed patient she needs to be taking potassium 2 times daily as prescribed during ED visit and not robaxin . Patient has not been taking potassium and was treated in ED 03/23/24 for hypokalemia. Recommended patient go back to ED due to chest pressure and SOB with exertion continues. Patient at work and does not report she will go back to ED.   CAL Brittany, notified.     1. LOCATION: Where does it hurt?       Upper chest area left shoulder  2. RADIATION: Does the pain go anywhere else? (e.g., into neck, jaw, arms, back)      Left shoulder  3. ONSET: When did the chest pain begin? (Minutes, hours or days)      Thursday  4. PATTERN: Does the pain come and go, or has it been constant since it started?  Does it get worse with exertion?      Comes and goes , SOB with exertion 5. DURATION: How long does it last (e.g., seconds, minutes, hours)     More than 6. SEVERITY: How bad is the pain?  (e.g., Scale 1-10; mild, moderate, or severe)     Did not report as severe but still feels chest discomfort 7. CARDIAC RISK FACTORS: Do you have any history of heart problems or risk factors for heart disease? (e.g., angina, prior heart attack; diabetes, high blood pressure, high cholesterol, smoker, or strong family history of heart disease)     HTN , heart attack.  8. PULMONARY RISK FACTORS: Do you have any history of lung disease?  (e.g., blood clots in lung, asthma, emphysema, birth control pills)     na 9. CAUSE: What do you think is causing the chest pain?     Not sure  10. OTHER SYMPTOMS: Do you have any other symptoms? (e.g., dizziness, nausea, vomiting, sweating, fever, difficulty breathing, cough)       Chest pain, like pressure. More than 5 minutes SOB with exertion. Feels sluggish no energy . No dizziness no sweating. Cough  mucus dark in color looks like blood  in it one time, sore throat.  11. PREGNANCY: Is there any chance you are pregnant? When was your last menstrual period?       na  Protocols used: Chest Pain-A-AH

## 2024-04-01 ENCOUNTER — Other Ambulatory Visit: Payer: Self-pay

## 2024-04-01 ENCOUNTER — Ambulatory Visit

## 2024-04-01 ENCOUNTER — Ambulatory Visit
Admission: EM | Admit: 2024-04-01 | Discharge: 2024-04-01 | Disposition: A | Discharge status: Home / Self Care | Attending: Student | Admitting: Student

## 2024-04-01 DIAGNOSIS — E876 Hypokalemia: Secondary | ICD-10-CM | POA: Diagnosis not present

## 2024-04-01 DIAGNOSIS — J069 Acute upper respiratory infection, unspecified: Secondary | ICD-10-CM

## 2024-04-01 DIAGNOSIS — R0789 Other chest pain: Secondary | ICD-10-CM | POA: Diagnosis not present

## 2024-04-01 LAB — POC COVID19/FLU A&B COMBO
Covid Antigen, POC: NEGATIVE
Influenza A Antigen, POC: NEGATIVE
Influenza B Antigen, POC: NEGATIVE

## 2024-04-01 MED ORDER — ALBUTEROL SULFATE HFA 108 (90 BASE) MCG/ACT IN AERS: 1.0000 | INHALATION_SPRAY | Freq: Four times a day (QID) | RESPIRATORY_TRACT | 0 refills | Status: AC | PRN

## 2024-04-01 MED ORDER — PREDNISONE 10 MG PO TABS: 20.0000 mg | ORAL_TABLET | Freq: Every day | ORAL | 0 refills | Status: AC

## 2024-04-01 MED ORDER — PROMETHAZINE-DM 6.25-15 MG/5ML PO SYRP: 5.0000 mL | ORAL_SOLUTION | Freq: Four times a day (QID) | ORAL | 0 refills | Status: AC | PRN

## 2024-04-01 NOTE — Discharge Instructions (Addendum)
-  Your COVID and influenza tests were negative. -You have a virus, like the common cold.  Viruses typically last 5 to 7 days.  After 7 days, your symptoms should be improving rather than worsening.  If your symptoms improve, and then worsen again, this is when we worry about a sinus infection or a lung infection, and you should return for additional care. -Your chest x-ray looks normal.  This means that you do not have a bacterial infection. - Your EKG looks normal today.  A normal EKG is a great sign.  However, an EKG is a brief picture in time of the heart, and does not totally rule out a heart issue.  For this evaluation, you would need cardiac monitoring and lab work done in the emergency department. - Follow-up with your new cardiologist on 04/11/2024. -Because your potassium was low recently, we are checking this lab. -We will call with any abnormal results in about 3 business days and can send treatment if necessary. -We will not call with negative lab results. -Lab results will automatically go to your MyChart. -To manage your viral symptoms: -Prednisone , 2 pills taken at the same time for 5 days in a row.  Try taking this earlier in the day as it can give you energy. Avoid NSAIDs like ibuprofen  and alleve while taking this medication as they can increase your risk of stomach upset and even GI bleeding when in combination with a steroid. You can continue tylenol (acetaminophen) up to 1000mg  3x daily. -Promethazine  DM cough syrup for congestion/cough. This could make you drowsy, so take at night before bed. -Albuterol  inhaler as needed for cough, wheezing, shortness of breath, 1 to 2 puffs every 6 hours as needed. -Your cough should slowly get better instead of worse. If you develop a cough productive of dark or red sputum, new shortness of breath, new chest tightness, new fevers, etc - seek additional care.

## 2024-04-01 NOTE — ED Triage Notes (Signed)
 Pt reports she has chest discomfort, cough, headaches, throat pain, and body aches x 1 day  Took theraflu

## 2024-04-01 NOTE — ED Provider Notes (Signed)
 " [facility]    CSN: [id number] Arrival date & time: 04/01/24  1205      History   Chief Complaint Chief Complaint  Patient presents with   Cough    HPI Tara Wolf is a 58 y.o. female presenting with viral syndrome. Pt reports she has chest discomfort, cough, headaches, throat pain, and body aches x 1 day. Cough is productive of yellow sputum, and is associated with shortness of breath that is worse with movement.  States her L upper chest wall and L shoulder hurts with movement. States at rest, she has chest heaviness.  States she went to the ER for the chest discomfort about 1 week ago on 03/23/24 and was diagnosed with chest wall pain, and has a f/u scheduled (Dr. Tobie / 04/11/24). Was prescribed potassium and pantoprazole, which she is taking as directed.    Took theraflu   The patient denies a history of pulmonary disease     HPI  Past Medical History:  Diagnosis Date   Arthritis    Chronic constipation    GERD (gastroesophageal reflux disease)    H/O: hysterectomy 07/10/2020   IBS (irritable bowel syndrome)    Nausea and vomiting    chronic   Syncope 05/19/2017    Patient Active Problem List   Diagnosis Date Noted   Strain of left trapezius muscle 12/23/2023   Hot flashes due to menopause 12/23/2023   GAD (generalized anxiety disorder) 12/23/2023   Upper respiratory infection 04/16/2023   Hypokalemia 10/09/2022   Right foot pain 10/08/2022   Encounter for examination following treatment at hospital 06/01/2022   Primary insomnia 06/01/2022   Chronic pain of both knees 04/01/2022   Encounter for general adult medical examination with abnormal findings 04/01/2022   Leg swelling 10/30/2021   Bilateral hand numbness 08/04/2021   Bilateral carpal tunnel syndrome 05/21/2021   Idiopathic peripheral neuropathy 05/21/2021   Prediabetes 03/04/2021   Obesity (BMI 30-39.9) 03/04/2021   Atypical chest pain 02/18/2021   Hyperlipidemia  07/10/2020   HTN (hypertension) 07/10/2020   Vitamin D  deficiency 07/10/2020   Impacted cerumen of both ears 07/10/2020   FH: colon cancer in first degree relative <74 years old 04/19/2018   Irritable bowel syndrome 05/19/2017   Gastroesophageal reflux disease 04/04/2009   Constipation 04/04/2009    Past Surgical History:  Procedure Laterality Date   ABDOMINAL HYSTERECTOMY     BREAST BIOPSY Left    neg   COLONOSCOPY N/A 06/23/2018   Procedure: COLONOSCOPY;  Surgeon: Harvey Margo CROME, MD;  Location: [facility];  Service: Endoscopy;  Laterality: N/A;  1:00pm   COLONOSCOPY WITH ESOPHAGOGASTRODUODENOSCOPY (EGD)  03/2009   Dr. harvey: Tortuous colon.  Otherwise normal colon.  Moderate sized sliding hiatal hernia.  Multiple erosions in the antrum.  Small bowel biopsies negative for celiac disease.   POLYPECTOMY  06/23/2018   Procedure: POLYPECTOMY;  Surgeon: Harvey Margo CROME, MD;  Location: [facility];  Service: Endoscopy;;  transverse colon polyp    OB History   No obstetric history on file.      Home Medications    Prior to Admission medications  Medication Sig Start Date End Date Taking? Authorizing Provider  albuterol  (VENTOLIN  HFA) 108 (90 Base) MCG/ACT inhaler Inhale 1-2 puffs into the lungs every 6 (six) hours as needed for wheezing or shortness of breath. 04/01/24  Yes Kinsey Karch E, PA-C  predniSONE  (DELTASONE ) 10 MG tablet Take 2 tablets (20 mg total) by mouth daily for  5 days. 04/01/24 04/06/24 Yes Leilynn Pilat E, PA-C  promethazine -dextromethorphan (PROMETHAZINE -DM) 6.25-15 MG/5ML syrup Take 5 mLs by mouth 4 (four) times daily as needed for cough. 04/01/24  Yes Mj Willis E, PA-C  esomeprazole  (NEXIUM ) 40 MG capsule Take 1 capsule (40 mg total) by mouth daily at 12 noon. 12/23/23   Tobie Suzzane POUR, MD  Fezolinetant  (VEOZAH ) 45 MG TABS Take 1 tablet (45 mg total) by mouth daily. 12/23/23   Tobie Suzzane POUR, MD  Fezolinetant  (VEOZAH ) 45 MG TABS Take 1 tablet (45 mg  total) by mouth daily. 12/23/23   Tobie Suzzane POUR, MD  hydrochlorothiazide  (HYDRODIURIL ) 25 MG tablet Take 1 tablet (25 mg total) by mouth daily. 12/23/23   Tobie Suzzane POUR, MD  nitroGLYCERIN  (NITROSTAT ) 0.4 MG SL tablet Place 1 tablet (0.4 mg total) under the tongue every 5 (five) minutes as needed for chest pain. 03/04/21 12/23/23  Tobb, Kardie, DO  rosuvastatin  (CRESTOR ) 5 MG tablet Take 1 tablet (5 mg total) by mouth daily. 12/24/23   Tobie Suzzane POUR, MD  traZODone  (DESYREL ) 50 MG tablet TAKE 0.5-1 TABLETS BY MOUTH AT BEDTIME AS NEEDED FOR SLEEP. 01/17/24   Tobie Suzzane POUR, MD    Family History Family History  Problem Relation Age of Onset   Breast cancer Cousin    Hypertension Mother    Cancer Father        colon cancer, age 56   Cancer Sister        patient doesn't know what kind   Cancer Brother        not sure primary    Social History Social History[1]   Allergies   Patient has no known allergies.   Review of Systems Review of Systems  Constitutional:  Negative for appetite change, chills and fever.  HENT:  Positive for congestion and sore throat. Negative for ear pain, rhinorrhea, sinus pressure and sinus pain.   Eyes:  Negative for redness and visual disturbance.  Respiratory:  Positive for cough. Negative for chest tightness, shortness of breath and wheezing.   Cardiovascular:  Negative for chest pain and palpitations.  Gastrointestinal:  Negative for abdominal pain, constipation, diarrhea, nausea and vomiting.  Genitourinary:  Negative for dysuria, frequency and urgency.  Musculoskeletal:  Positive for myalgias.  Neurological:  Positive for headaches. Negative for dizziness and weakness.  Psychiatric/Behavioral:  Negative for confusion.   All other systems reviewed and are negative.    Physical Exam Triage Vital Signs ED Triage Vitals  Encounter Vitals Group     BP 04/01/24 1240 138/69     Girls Systolic BP Percentile --      Girls Diastolic BP Percentile --       Boys Systolic BP Percentile --      Boys Diastolic BP Percentile --      Pulse Rate 04/01/24 1240 62     Resp 04/01/24 1240 18     Temp 04/01/24 1240 98.5 F (36.9 C)     Temp Source 04/01/24 1240 Oral     SpO2 04/01/24 1240 96 %     Weight --      Height --      Head Circumference --      Peak Flow --      Pain Score 04/01/24 1239 5     Pain Loc --      Pain Education --      Exclude from Growth Chart --    No data found.  Updated Vital Signs BP  138/69 (BP Location: Right Arm)   Pulse 62   Temp 98.5 F (36.9 C) (Oral)   Resp 18   SpO2 96%   Visual Acuity Right Eye Distance:   Left Eye Distance:   Bilateral Distance:    Right Eye Near:   Left Eye Near:    Bilateral Near:     Physical Exam Vitals reviewed.  Constitutional:      General: She is not in acute distress.    Appearance: Normal appearance. She is ill-appearing.  HENT:     Head: Normocephalic and atraumatic.     Right Ear: Tympanic membrane, ear canal and external ear normal. No tenderness. No middle ear effusion. There is no impacted cerumen. Tympanic membrane is not perforated, erythematous, retracted or bulging.     Left Ear: Tympanic membrane, ear canal and external ear normal. No tenderness.  No middle ear effusion. There is no impacted cerumen. Tympanic membrane is not perforated, erythematous, retracted or bulging.     Nose: Nose normal. No congestion.     Mouth/Throat:     Mouth: Mucous membranes are moist.     Pharynx: Uvula midline. No oropharyngeal exudate or posterior oropharyngeal erythema.     Tonsils: No tonsillar exudate.  Eyes:     Extraocular Movements: Extraocular movements intact.     Pupils: Pupils are equal, round, and reactive to light.  Cardiovascular:     Rate and Rhythm: Normal rate and regular rhythm.     Heart sounds: Normal heart sounds.  Pulmonary:     Effort: Pulmonary effort is normal.     Breath sounds: Normal breath sounds. No decreased breath sounds, wheezing,  rhonchi or rales.  Chest:     Comments: There is left-sided anterior chest wall tenderness to palpation along ribs 1 through 4. Abdominal:     Palpations: Abdomen is soft.     Tenderness: There is no abdominal tenderness. There is no guarding or rebound.  Musculoskeletal:     Comments: No pedal edema  Lymphadenopathy:     Cervical: No cervical adenopathy.     Right cervical: No superficial, deep or posterior cervical adenopathy.    Left cervical: No superficial, deep or posterior cervical adenopathy.  Skin:    Comments: No rash   Neurological:     General: No focal deficit present.     Mental Status: She is alert and oriented to person, place, and time.  Psychiatric:        Mood and Affect: Mood normal.        Behavior: Behavior normal.        Thought Content: Thought content normal.        Judgment: Judgment normal.      UC Treatments / Results  Labs (all labs ordered are listed, but only abnormal results are displayed) Labs Reviewed  POC COVID19/FLU A&B COMBO - Normal  BASIC METABOLIC PANEL WITH GFR    EKG   Radiology DG Chest 2 View Result Date: 04/01/2024 CLINICAL DATA:  Chest discomfort with cough, headaches, throat pain and body aches x1 day. EXAM: CHEST - 2 VIEW COMPARISON:  October 17, 2021 FINDINGS: The heart size and mediastinal contours are within normal limits. Both lungs are clear. The visualized skeletal structures are unremarkable. IMPRESSION: No active cardiopulmonary disease. Electronically Signed   By: Suzen Dials M.D.   On: 04/01/2024 14:47    Procedures Procedures (including critical care time)  Medications Ordered in UC Medications - No data to display  Initial Impression /  Assessment and Plan / UC Course  I have reviewed the triage vital signs and the nursing notes.  Pertinent labs & imaging results that were available during my care of the patient were reviewed by me and considered in my medical decision making (see chart for details).      Patient is a pleasant 58 y.o. female presenting with flulike illness, and chest wall pain. The patient is afebrile and nontachycardic.  Antipyretic has not been administered today.  -Covid negative -Influenza negative  CXR: No active cardiopulmonary disease.   EKG NSR, comparable with 02/2021 EKG  Chest wall pain is reproducible. Was worked up for this in the ER on 03/23/24, and has a cardiology f/u on 04/11/24. Was diagnosed with hypokalemia at that time. She has been taking potassium and pantoprazole as directed since then. Will check a BMP today.   We will manage her viral syndrome with Promethazine  DM, prednisone  burst, and albuterol  inhaler.  Return precautions as below.  Final Clinical Impressions(s) / UC Diagnoses   Final diagnoses:  Hypokalemia  Atypical chest pain  Viral URI with cough     Discharge Instructions      -Your COVID and influenza tests were negative. -You have a virus, like the common cold.  Viruses typically last 5 to 7 days.  After 7 days, your symptoms should be improving rather than worsening.  If your symptoms improve, and then worsen again, this is when we worry about a sinus infection or a lung infection, and you should return for additional care. -Your chest x-ray looks normal.  This means that you do not have a bacterial infection. - Your EKG looks normal today.  A normal EKG is a great sign.  However, an EKG is a brief picture in time of the heart, and does not totally rule out a heart issue.  For this evaluation, you would need cardiac monitoring and lab work done in the emergency department. - Follow-up with your new cardiologist on 04/11/2024. -Because your potassium was low recently, we are checking this lab. -We will call with any abnormal results in about 3 business days and can send treatment if necessary. -We will not call with negative lab results. -Lab results will automatically go to your MyChart. -To manage your viral  symptoms: -Prednisone , 2 pills taken at the same time for 5 days in a row.  Try taking this earlier in the day as it can give you energy. Avoid NSAIDs like ibuprofen  and alleve while taking this medication as they can increase your risk of stomach upset and even GI bleeding when in combination with a steroid. You can continue tylenol (acetaminophen) up to 1000mg  3x daily. -Promethazine  DM cough syrup for congestion/cough. This could make you drowsy, so take at night before bed. -Albuterol  inhaler as needed for cough, wheezing, shortness of breath, 1 to 2 puffs every 6 hours as needed. -Your cough should slowly get better instead of worse. If you develop a cough productive of dark or red sputum, new shortness of breath, new chest tightness, new fevers, etc - seek additional care.     ED Prescriptions     Medication Sig Dispense Auth. Provider   promethazine -dextromethorphan (PROMETHAZINE -DM) 6.25-15 MG/5ML syrup Take 5 mLs by mouth 4 (four) times daily as needed for cough. 118 mL Lavene Penagos E, PA-C   predniSONE  (DELTASONE ) 10 MG tablet Take 2 tablets (20 mg total) by mouth daily for 5 days. 10 tablet Taura Lamarre E, PA-C   albuterol  (VENTOLIN  HFA)  108 (90 Base) MCG/ACT inhaler Inhale 1-2 puffs into the lungs every 6 (six) hours as needed for wheezing or shortness of breath. 1 each Arlyss Leita BRAVO, PA-C      PDMP not reviewed this encounter.     [1]  Social History Tobacco Use   Smoking status: Never   Smokeless tobacco: Never  Vaping Use   Vaping status: Never Used  Substance Use Topics   Alcohol use: Yes    Comment: ocassional   Drug use: No     Arlyss Leita BRAVO, PA-C 04/01/24 1501  "

## 2024-04-02 LAB — BASIC METABOLIC PANEL WITH GFR
BUN/Creatinine Ratio: 18 (ref 9–23)
BUN: 15 mg/dL (ref 6–24)
CO2: 22 mmol/L (ref 20–29)
Calcium: 9.6 mg/dL (ref 8.7–10.2)
Chloride: 104 mmol/L (ref 96–106)
Creatinine, Ser: 0.85 mg/dL (ref 0.57–1.00)
Glucose: 124 mg/dL — ABNORMAL HIGH (ref 70–99)
Potassium: 3.6 mmol/L (ref 3.5–5.2)
Sodium: 141 mmol/L (ref 134–144)
eGFR: 80 mL/min/1.73

## 2024-04-03 ENCOUNTER — Ambulatory Visit (HOSPITAL_COMMUNITY): Payer: Self-pay

## 2024-04-11 ENCOUNTER — Encounter: Payer: Self-pay | Admitting: Internal Medicine

## 2024-04-11 ENCOUNTER — Ambulatory Visit: Payer: Self-pay | Admitting: Internal Medicine

## 2024-04-11 VITALS — BP 130/78 | HR 76 | Ht 69.0 in | Wt 203.4 lb

## 2024-04-11 DIAGNOSIS — Z09 Encounter for follow-up examination after completed treatment for conditions other than malignant neoplasm: Secondary | ICD-10-CM

## 2024-04-11 DIAGNOSIS — E876 Hypokalemia: Secondary | ICD-10-CM | POA: Diagnosis not present

## 2024-04-11 DIAGNOSIS — J209 Acute bronchitis, unspecified: Secondary | ICD-10-CM | POA: Diagnosis not present

## 2024-04-11 DIAGNOSIS — R0789 Other chest pain: Secondary | ICD-10-CM | POA: Diagnosis not present

## 2024-04-11 MED ORDER — POTASSIUM CHLORIDE ER 20 MEQ PO TBCR: 20.0000 meq | EXTENDED_RELEASE_TABLET | Freq: Every day | ORAL | 5 refills | Status: DC

## 2024-04-11 MED ORDER — METHYLPREDNISOLONE 4 MG PO TBPK: ORAL_TABLET | ORAL | 0 refills | Status: AC

## 2024-04-11 NOTE — Progress Notes (Unsigned)
 "  Established Patient Office Visit  Subjective:  Patient ID: Tara Wolf, female    DOB: June 27, 1965  Age: 58 y.o. MRN: [id number]  CC:  Chief Complaint  Patient presents with   Hospitalization Follow-up    Following up reports feeling better.    HPI Tara Wolf is a 58 y.o. female with past medical history of HTN, GERD, IBS-C and HLD who presents for f/u of recent ER visit.  She went to [facility] ER on 03/23/24 for left sided chest wall pain.  Cardiac troponin, EKG and CXR were unremarkable.  She was told of chest wall pain due to musculoskeletal etiology.  She went to urgent care on 04/01/24 with complaint of cough and chest wall pain, had unremarkable EKG again and was given oral prednisone , albuterol  inhaler and promethazine -DM syrup for viral URTI/acute bronchitis.  She has noticed improvement in cough and chest congestion, but still reports left-sided chest wall pain.  She reports improvement in dyspnea with albuterol  inhaler.  Denies any headache, dizziness or palpitations.  Her BP is WNL today.   Past Medical History:  Diagnosis Date   Arthritis    Chronic constipation    GERD (gastroesophageal reflux disease)    H/O: hysterectomy 07/10/2020   IBS (irritable bowel syndrome)    Nausea and vomiting    chronic   Syncope 05/19/2017    Past Surgical History:  Procedure Laterality Date   ABDOMINAL HYSTERECTOMY     BREAST BIOPSY Left    neg   COLONOSCOPY N/A 06/23/2018   Procedure: COLONOSCOPY;  Surgeon: Harvey Margo CROME, MD;  Location: [facility];  Service: Endoscopy;  Laterality: N/A;  1:00pm   COLONOSCOPY WITH ESOPHAGOGASTRODUODENOSCOPY (EGD)  03/2009   Dr. harvey: Tortuous colon.  Otherwise normal colon.  Moderate sized sliding hiatal hernia.  Multiple erosions in the antrum.  Small bowel biopsies negative for celiac disease.   POLYPECTOMY  06/23/2018   Procedure: POLYPECTOMY;  Surgeon: Harvey Margo CROME, MD;  Location: [facility];  Service:  Endoscopy;;  transverse colon polyp    Family History  Problem Relation Age of Onset   Breast cancer Cousin    Hypertension Mother    Cancer Father        colon cancer, age 102   Cancer Sister        patient doesn't know what kind   Cancer Brother        not sure primary    Social History   Socioeconomic History   Marital status: Single    Spouse name: Not on file   Number of children: Not on file   Years of education: Not on file   Highest education level: Some college, no degree  Occupational History   Not on file  Tobacco Use   Smoking status: Never   Smokeless tobacco: Never  Vaping Use   Vaping status: Never Used  Substance and Sexual Activity   Alcohol use: Yes    Comment: ocassional   Drug use: No   Sexual activity: Not on file  Other Topics Concern   Not on file  Social History Narrative   Not on file   Social Drivers of Health   Tobacco Use: Low Risk (04/11/2024)   Patient History    Smoking Tobacco Use: Never    Smokeless Tobacco Use: Never    Passive Exposure: Not on file  Financial Resource Strain: Medium Risk (10/04/2022)   Overall Financial Resource Strain (CARDIA)    Difficulty of  Paying Living Expenses: Somewhat hard  Food Insecurity: Food Insecurity Present (10/04/2022)   Hunger Vital Sign    Worried About Running Out of Food in the Last Year: Never true    Ran Out of Food in the Last Year: Sometimes true  Transportation Needs: No Transportation Needs (10/04/2022)   PRAPARE - Administrator, Civil Service (Medical): No    Lack of Transportation (Non-Medical): No  Physical Activity: Insufficiently Active (10/04/2022)   Exercise Vital Sign    Days of Exercise per Week: 6 days    Minutes of Exercise per Session: 10 min  Stress: No Stress Concern Present (10/04/2022)   [employer] of Occupational Health - Occupational Stress Questionnaire    Feeling of Stress : Only a little  Social Connections: Moderately Integrated (10/04/2022)    Social Connection and Isolation Panel    Frequency of Communication with Friends and Family: More than three times a week    Frequency of Social Gatherings with Friends and Family: Twice a week    Attends Religious Services: More than 4 times per year    Active Member of [employer] or Organizations: Yes    Attends Engineer, Structural: More than 4 times per year    Marital Status: Divorced  Intimate Partner Violence: Not on file  Depression (PHQ2-9): Low Risk (04/11/2024)   Depression (PHQ2-9)    PHQ-2 Score: 2  Alcohol Screen: Not on file  Housing: Medium Risk (10/04/2022)   Housing    Last Housing Risk Score: 1  Utilities: Not on file  Health Literacy: Not on file    Outpatient Medications Prior to Visit  Medication Sig Dispense Refill   albuterol  (VENTOLIN  HFA) 108 (90 Base) MCG/ACT inhaler Inhale 1-2 puffs into the lungs every 6 (six) hours as needed for wheezing or shortness of breath. 1 each 0   esomeprazole  (NEXIUM ) 40 MG capsule Take 1 capsule (40 mg total) by mouth daily at 12 noon. 30 capsule 5   Fezolinetant  (VEOZAH ) 45 MG TABS Take 1 tablet (45 mg total) by mouth daily. 30 tablet 5   hydrochlorothiazide  (HYDRODIURIL ) 25 MG tablet Take 1 tablet (25 mg total) by mouth daily. 90 tablet 3   nitroGLYCERIN  (NITROSTAT ) 0.4 MG SL tablet Place 1 tablet (0.4 mg total) under the tongue every 5 (five) minutes as needed for chest pain. 90 tablet 3   promethazine -dextromethorphan (PROMETHAZINE -DM) 6.25-15 MG/5ML syrup Take 5 mLs by mouth 4 (four) times daily as needed for cough. 118 mL 0   rosuvastatin  (CRESTOR ) 5 MG tablet Take 1 tablet (5 mg total) by mouth daily. 90 tablet 3   traZODone  (DESYREL ) 50 MG tablet TAKE 0.5-1 TABLETS BY MOUTH AT BEDTIME AS NEEDED FOR SLEEP. 90 tablet 2   Fezolinetant  (VEOZAH ) 45 MG TABS Take 1 tablet (45 mg total) by mouth daily.     potassium chloride  (KLOR-CON ) 10 MEQ tablet Take 10 mEq by mouth 2 (two) times daily.     No facility-administered  medications prior to visit.    Allergies[1]  ROS Review of Systems  Constitutional:  Negative for chills and fever.  HENT:  Negative for congestion, ear discharge, sinus pressure, sinus pain and sore throat.   Eyes:  Negative for pain and discharge.  Respiratory:  Negative for cough and shortness of breath.   Cardiovascular:  Negative for palpitations and leg swelling.  Gastrointestinal:  Positive for constipation. Negative for diarrhea, nausea and vomiting.  Endocrine: Negative for polydipsia and polyuria.  Genitourinary:  Negative  for dysuria and hematuria.  Musculoskeletal:  Positive for arthralgias, myalgias and neck pain. Negative for neck stiffness.       Right foot pain  Skin:  Negative for rash.  Neurological:  Positive for numbness (B/l hands) and headaches. Negative for dizziness and weakness.  Psychiatric/Behavioral:  Positive for sleep disturbance. Negative for agitation and behavioral problems. The patient is nervous/anxious.       Objective:    Physical Exam Vitals reviewed.  Constitutional:      General: She is not in acute distress.    Appearance: She is not diaphoretic.  HENT:     Head: Normocephalic and atraumatic.     Nose: Nose normal.     Mouth/Throat:     Mouth: Mucous membranes are moist.     Pharynx: No posterior oropharyngeal erythema.  Eyes:     General: No scleral icterus.    Extraocular Movements: Extraocular movements intact.  Cardiovascular:     Rate and Rhythm: Normal rate and regular rhythm.     Heart sounds: Normal heart sounds. No murmur heard. Pulmonary:     Breath sounds: Normal breath sounds. No wheezing or rales.  Chest:     Chest wall: Tenderness (Left upper chest wall area) present.  Musculoskeletal:     Cervical back: Neck supple. No tenderness. Pain with movement present.     Right lower leg: No edema.     Left lower leg: No edema.  Skin:    General: Skin is warm.     Findings: No rash.  Neurological:     General: No  focal deficit present.     Mental Status: She is alert and oriented to person, place, and time.     Sensory: No sensory deficit.     Motor: No weakness.  Psychiatric:        Mood and Affect: Mood is anxious.        Behavior: Behavior is cooperative.     BP 130/78   Pulse 76   Ht 5' 9 (1.753 m)   Wt 203 lb 6.4 oz (92.3 kg)   SpO2 99%   BMI 30.04 kg/m  Wt Readings from Last 3 Encounters:  04/11/24 203 lb 6.4 oz (92.3 kg)  12/23/23 202 lb 9.6 oz (91.9 kg)  04/16/23 209 lb (94.8 kg)    Lab Results  Component Value Date   TSH 1.280 12/23/2023   Lab Results  Component Value Date   WBC 4.8 12/23/2023   HGB 12.7 12/23/2023   HCT 40.9 12/23/2023   MCV 84 12/23/2023   PLT 247 12/23/2023   Lab Results  Component Value Date   NA 141 04/01/2024   K 3.6 04/01/2024   CO2 22 04/01/2024   GLUCOSE 124 (H) 04/01/2024   BUN 15 04/01/2024   CREATININE 0.85 04/01/2024   BILITOT 0.5 12/23/2023   ALKPHOS 76 12/23/2023   AST 21 12/23/2023   ALT 15 12/23/2023   PROT 7.5 12/23/2023   ALBUMIN 4.3 12/23/2023   CALCIUM  9.6 04/01/2024   ANIONGAP 9 06/12/2020   EGFR 80 04/01/2024   Lab Results  Component Value Date   CHOL 244 (H) 12/23/2023   Lab Results  Component Value Date   HDL 50 12/23/2023   Lab Results  Component Value Date   LDLCALC 178 (H) 12/23/2023   Lab Results  Component Value Date   TRIG 90 12/23/2023   Lab Results  Component Value Date   CHOLHDL 4.9 (H) 12/23/2023   Lab Results  Component Value Date   HGBA1C 6.3 (H) 12/23/2023      Assessment & Plan:   Problem List Items Addressed This Visit       Respiratory   Acute bronchitis   Recently completed oral prednisone  Uses albuterol  as needed for dyspnea, reports improvement Promethazine -DM syrup as needed for cough      Relevant Medications   methylPREDNISolone (MEDROL DOSEPAK) 4 MG TBPK tablet     Other   Atypical chest pain - Primary   Recent EKG reviewed, no signs of active  ischemia Left-sided chest wall pain likely due to pectoralis muscle strain - medrol dosepak prescribed She reports lifting about 50 pounds boxes at her workplace Advised to avoid heavy lifting, work note provided Has been seen by cardiology for atypical chest pain around 2023 CT coronary was unremarkable      Encounter for examination following treatment at hospital   ER chart reviewed, including imaging Has completed oral prednisone  - symptoms improved Has chronic myalgia and insomnia      Hypokalemia   Recent BMP during ER visit showed hypokalemia - could be due to HCTZ Started Klor-Con  20 mEq QD      Relevant Medications   potassium chloride  20 MEQ TBCR    Meds ordered this encounter  Medications   methylPREDNISolone (MEDROL DOSEPAK) 4 MG TBPK tablet    Sig: Take as package instructions.    Dispense:  1 each    Refill:  0   potassium chloride  20 MEQ TBCR    Sig: Take 1 tablet (20 mEq total) by mouth daily.    Dispense:  30 tablet    Refill:  5    Follow-up: Return if symptoms worsen or fail to improve.    Suzzane MARLA Blanch, MD     [1] No Known Allergies  "

## 2024-04-11 NOTE — Patient Instructions (Signed)
 Please start taking Potassium supplement as discussed.  Please start taking Prednisone  as prescribed.  Please avoid heavy lifting for next 2 weeks.

## 2024-04-12 ENCOUNTER — Telehealth: Payer: Self-pay | Admitting: Internal Medicine

## 2024-04-12 NOTE — Assessment & Plan Note (Signed)
 Recently completed oral prednisone  Uses albuterol  as needed for dyspnea, reports improvement Promethazine -DM syrup as needed for cough

## 2024-04-12 NOTE — Assessment & Plan Note (Addendum)
 Recent EKG reviewed, no signs of active ischemia Left-sided chest wall pain likely due to pectoralis muscle strain - medrol dosepak prescribed She reports lifting about 50 pounds boxes at her workplace Advised to avoid heavy lifting, work note provided Has been seen by cardiology for atypical chest pain around 2023 CT coronary was unremarkable

## 2024-04-12 NOTE — Assessment & Plan Note (Signed)
 ER chart reviewed, including imaging Has completed oral prednisone  - symptoms improved Has chronic myalgia and insomnia

## 2024-04-12 NOTE — Assessment & Plan Note (Signed)
 Recent BMP during ER visit showed hypokalemia - could be due to HCTZ Started Klor-Con  20 mEq QD

## 2024-04-12 NOTE — Telephone Encounter (Signed)
 FMLA forms Noted Copied Sleeved Original placed provider box (Dr Tobie) Copy placed front desk folder

## 2024-04-14 ENCOUNTER — Telehealth: Payer: Self-pay

## 2024-04-14 ENCOUNTER — Encounter: Payer: Self-pay | Admitting: Internal Medicine

## 2024-04-14 NOTE — Telephone Encounter (Signed)
 Copied from CRM [id number]. Topic: Clinical - Medical Advice >> Apr 12, 2024  1:28 PM Mia F wrote: Reason for CRM: Pt is asking if her work note can be changed from 2 weeks to one week. She asks to be called once done

## 2024-04-14 NOTE — Telephone Encounter (Signed)
 Letter ready for pick up at the front desk , pt has been made aware.

## 2024-04-24 ENCOUNTER — Ambulatory Visit: Admitting: Internal Medicine

## 2024-05-03 ENCOUNTER — Other Ambulatory Visit: Payer: Self-pay | Admitting: Internal Medicine

## 2024-05-03 DIAGNOSIS — E876 Hypokalemia: Secondary | ICD-10-CM

## 2024-08-02 ENCOUNTER — Ambulatory Visit: Payer: Self-pay | Admitting: Internal Medicine
